# Patient Record
Sex: Male | Born: 1940
Health system: Southern US, Community
[De-identification: ages and names within clinical notes are randomized; demographics above are authoritative.]

## PROBLEM LIST (undated history)

## (undated) DIAGNOSIS — J189 Pneumonia, unspecified organism: Secondary | ICD-10-CM

## (undated) DIAGNOSIS — E119 Type 2 diabetes mellitus without complications: Secondary | ICD-10-CM

## (undated) DIAGNOSIS — I440 Atrioventricular block, first degree: Secondary | ICD-10-CM

## (undated) DIAGNOSIS — R600 Localized edema: Secondary | ICD-10-CM

## (undated) DIAGNOSIS — I10 Essential (primary) hypertension: Secondary | ICD-10-CM

## (undated) DIAGNOSIS — E785 Hyperlipidemia, unspecified: Secondary | ICD-10-CM

## (undated) DIAGNOSIS — N393 Stress incontinence (female) (male): Secondary | ICD-10-CM

## (undated) DIAGNOSIS — N3001 Acute cystitis with hematuria: Secondary | ICD-10-CM

## (undated) DIAGNOSIS — H9190 Unspecified hearing loss, unspecified ear: Secondary | ICD-10-CM

## (undated) DIAGNOSIS — M1990 Unspecified osteoarthritis, unspecified site: Secondary | ICD-10-CM

## (undated) HISTORY — PX: PROSTATE SURGERY: SHX751

## (undated) HISTORY — DX: Unspecified hearing loss, unspecified ear: H91.90

## (undated) HISTORY — DX: Stress incontinence (female) (male): N39.3

## (undated) HISTORY — DX: Atrioventricular block, first degree: I44.0

## (undated) HISTORY — DX: Localized edema: R60.0

## (undated) HISTORY — DX: Hyperlipidemia, unspecified: E78.5

## (undated) HISTORY — PX: COLONOSCOPY: SHX174

## (undated) HISTORY — DX: Acute cystitis with hematuria: N30.01

---

## 2012-12-06 ENCOUNTER — Emergency Department (HOSPITAL_COMMUNITY)
Admission: EM | Admit: 2012-12-06 | Discharge: 2012-12-06 | Disposition: A | Discharge status: Home / Self Care | Payer: Medicare HMO | Attending: Emergency Medicine | Admitting: Emergency Medicine

## 2012-12-06 ENCOUNTER — Encounter (HOSPITAL_COMMUNITY): Payer: Self-pay | Admitting: Emergency Medicine

## 2012-12-06 DIAGNOSIS — I10 Essential (primary) hypertension: Secondary | ICD-10-CM | POA: Insufficient documentation

## 2012-12-06 DIAGNOSIS — M129 Arthropathy, unspecified: Secondary | ICD-10-CM | POA: Insufficient documentation

## 2012-12-06 DIAGNOSIS — Z87891 Personal history of nicotine dependence: Secondary | ICD-10-CM | POA: Insufficient documentation

## 2012-12-06 DIAGNOSIS — Z79899 Other long term (current) drug therapy: Secondary | ICD-10-CM | POA: Insufficient documentation

## 2012-12-06 DIAGNOSIS — Z859 Personal history of malignant neoplasm, unspecified: Secondary | ICD-10-CM | POA: Insufficient documentation

## 2012-12-06 DIAGNOSIS — M5430 Sciatica, unspecified side: Secondary | ICD-10-CM | POA: Insufficient documentation

## 2012-12-06 DIAGNOSIS — M5432 Sciatica, left side: Secondary | ICD-10-CM

## 2012-12-06 HISTORY — DX: Essential (primary) hypertension: I10

## 2012-12-06 HISTORY — DX: Unspecified osteoarthritis, unspecified site: M19.90

## 2012-12-06 MED ORDER — HYDROCODONE-ACETAMINOPHEN 5-325 MG PO TABS: 1.0000 | ORAL_TABLET | Freq: Four times a day (QID) | ORAL | Status: DC | PRN

## 2012-12-06 MED ORDER — DEXAMETHASONE SODIUM PHOSPHATE 10 MG/ML IJ SOLN
10.0000 mg | Freq: Once | INTRAMUSCULAR | Status: AC
  Administered 2012-12-06: 10 mg via INTRAMUSCULAR
  Filled 2012-12-06: qty 1

## 2012-12-06 NOTE — ED Notes (Signed)
Pt states that he experienced leg and back pain, and muscle spasms in his left groin after kneeling on Saturday. Pt reports a hx of arthritis.

## 2012-12-06 NOTE — ED Provider Notes (Signed)
History     CSN: [id number]  Arrival date & time 12/06/12  1142   First MD Initiated Contact with Patient 12/06/12 1225      Chief Complaint  Patient presents with  . Leg Pain    (Consider location/radiation/quality/duration/timing/severity/associated sxs/prior treatment) HPI  72 year old male presents emergency department with chief complaint of left knee pain.  Patient states that on Friday he was at a family reunion and was helping to erect a tent He had 2 squat and get down on his knees to drive in the tent stakes. Saturday the patient began having pain that radiated down the back of his left leg and around the front of his left leg to the knee.  Patient states that he has progressively worsened.  It is worse when he stands erect.  The patient feels more comfortable when he's bent over.  He has never had any pain like this before.  He denies any urinary symptoms.  He denies any history of kidney stones.  Patient denies any abdominal pain, nausea, vomiting. Denies weakness, loss of bowel/bladder function or saddle anesthesia. Denies neck stiffness, headache, rash.  Denies fever or recent procedures to back. Pain described as constant. Intermittently shooting and electric. Radiates to the right hip, groin and knee and down the back of the lef. Denies any testicle pain or flank pain.   Past Medical History  Diagnosis Date  . Arthritis   . Hypertension     Borderline hypertension.  . Cancer     Past Surgical History  Procedure Laterality Date  . Prostate surgery      History reviewed. No pertinent family history.  History  Substance Use Topics  . Smoking status: Former Games developer  . Smokeless tobacco: Not on file  . Alcohol Use: No      Review of Systems Ten systems reviewed and are negative for acute change, except as noted in the HPI.   Allergies  Review of patient's allergies indicates no known allergies.  Home Medications   Current Outpatient Rx  Name  Route  Sig   Dispense  Refill  . amLODipine-benazepril (LOTREL) 5-40 MG per capsule   Oral   Take 1 capsule by mouth daily.           BP 160/74  Pulse 87  Temp(Src) 98.2 F (36.8 C) (Oral)  Resp 18  Ht 5\' 11"  (1.803 m)  Wt 206 lb (93.441 kg)  BMI 28.74 kg/m2  SpO2 99%  Physical Exam Physical Exam  Nursing note and vitals reviewed. Constitutional: He appears well-developed and well-nourished. No distress.  HENT:  Head: Normocephalic and atraumatic.  Eyes: Conjunctivae normal are normal. No scleral icterus.  Neck: Normal range of motion. Neck supple.  Cardiovascular: Normal rate, regular rhythm and normal heart sounds.   Pulmonary/Chest: Effort normal and breath sounds normal. No respiratory distress.  Abdominal: Soft. There is no tenderness.  Musculoskeletal: He exhibits no edema.  Back exam: antalgic gait, limited range of motion, pain with motion noted during exam, tenderness noted in left lumbar paraspinals, normal reflexes and strength bilateral lower extremities, sensory exam intact bilateral lower extremities,. Neurological: He is alert.  Skin: Skin is warm and dry. He is not diaphoretic.  Psychiatric: His behavior is normal.    ED Course  Procedures (including critical care time)  Labs Reviewed - No data to display No results found.   1. Sciatica, left       MDM  1:26 PM BP 160/74  Pulse 87  Temp(Src)  98.2 F (36.8 C) (Oral)  Resp 18  Ht 5\' 11"  (1.803 m)  Wt 206 lb (93.441 kg)  BMI 28.74 kg/m2  SpO2 99% Patient with sxs consistent with L3/L4  And sciatic nerve compression. Also consistent with history. Will D/C with Norco. No imaging indicated. F/U with ortho asap.Arthor Captain, PA-C 12/06/12 1331

## 2012-12-07 NOTE — ED Provider Notes (Signed)
Medical screening examination/treatment/procedure(s) were performed by non-physician practitioner and as supervising physician I was immediately available for consultation/collaboration.    Vida Roller, MD 12/07/12 [phone]

## 2013-05-31 ENCOUNTER — Ambulatory Visit
Admission: RE | Admit: 2013-05-31 | Discharge: 2013-05-31 | Disposition: A | Discharge status: Home / Self Care | Payer: Medicare HMO | Source: Ambulatory Visit | Attending: Family Medicine | Admitting: Family Medicine

## 2013-05-31 ENCOUNTER — Other Ambulatory Visit: Payer: Self-pay | Admitting: Family Medicine

## 2013-05-31 DIAGNOSIS — M25571 Pain in right ankle and joints of right foot: Secondary | ICD-10-CM

## 2013-05-31 DIAGNOSIS — M25511 Pain in right shoulder: Secondary | ICD-10-CM

## 2013-09-18 ENCOUNTER — Ambulatory Visit: Payer: Commercial Managed Care - HMO | Admitting: Podiatry

## 2013-09-25 ENCOUNTER — Ambulatory Visit (INDEPENDENT_AMBULATORY_CARE_PROVIDER_SITE_OTHER): Payer: Commercial Managed Care - HMO

## 2013-09-25 ENCOUNTER — Encounter: Payer: Self-pay | Admitting: Podiatry

## 2013-09-25 ENCOUNTER — Ambulatory Visit (INDEPENDENT_AMBULATORY_CARE_PROVIDER_SITE_OTHER): Payer: Commercial Managed Care - HMO | Admitting: Podiatry

## 2013-09-25 VITALS — BP 138/76 | HR 91 | Resp 16 | Ht 71.0 in | Wt 204.0 lb

## 2013-09-25 DIAGNOSIS — M79609 Pain in unspecified limb: Secondary | ICD-10-CM

## 2013-09-25 DIAGNOSIS — S93409A Sprain of unspecified ligament of unspecified ankle, initial encounter: Secondary | ICD-10-CM

## 2013-09-25 DIAGNOSIS — M79673 Pain in unspecified foot: Secondary | ICD-10-CM

## 2013-09-25 DIAGNOSIS — R609 Edema, unspecified: Secondary | ICD-10-CM

## 2013-09-25 DIAGNOSIS — M779 Enthesopathy, unspecified: Secondary | ICD-10-CM

## 2013-09-25 MED ORDER — TRIAMCINOLONE ACETONIDE 10 MG/ML IJ SUSP
10.0000 mg | Freq: Once | INTRAMUSCULAR | Status: AC
  Administered 2013-09-25: 10 mg

## 2013-09-25 NOTE — Progress Notes (Signed)
   Subjective:    Patient ID: Paul Alvarado, male    DOB: 07/15/1940, 73 y.o.   MRN: [id number]  HPI Comments: N swelling L rt ankle and foot D November 2014 O sudden C worse A walking T soaks in warm water  Foot Pain      Review of Systems  Cardiovascular:       Leg swelling  All other systems reviewed and are negative.       Objective:   Physical Exam        Assessment & Plan:

## 2013-09-25 NOTE — Progress Notes (Signed)
Subjective:     Patient ID: Paul Alvarado, male   DOB: 03-Jan-1941, 73 y.o.   MRN: [id number]  Foot Pain   patient points to right ankle and states that he slipped in November and turned his ankle and it has been swollen ever since. States it is mildly tender but is more the swelling that bothers him   Review of Systems  All other systems reviewed and are negative.       Objective:   Physical Exam  Nursing note and vitals reviewed. Constitutional: He is oriented to person, place, and time.  Cardiovascular: Intact distal pulses.   Musculoskeletal: Normal range of motion.  Neurological: He is oriented to person, place, and time.  Skin: Skin is dry.   neurovascular status was found to be intact with edema in the right foot and ankle noted. I noted there to be normal range of motion the subtalar midtarsal joint left and mild restriction on the right as he is splinting and discomfort within the sinus tarsi on the right foot. Negative Homans sign was noted currently and he states the swelling does go away overnight     Assessment:     Inflammation with chronic swelling of foot and ankle right secondary to injury with inflammation of the sinus tarsi    Plan:     H&P and x-ray reviewed. Injected the right sinus tarsi 3 mg Kenalog 5 mg Xylocaine Marcaine mixture and applied Unna boot Ace wrap to reduce swelling along with surgical shoe.

## 2013-10-02 ENCOUNTER — Encounter: Payer: Self-pay | Admitting: Podiatry

## 2013-10-02 ENCOUNTER — Ambulatory Visit (INDEPENDENT_AMBULATORY_CARE_PROVIDER_SITE_OTHER): Payer: Commercial Managed Care - HMO | Admitting: Podiatry

## 2013-10-02 VITALS — BP 143/78 | HR 88 | Resp 16

## 2013-10-02 DIAGNOSIS — M7750 Other enthesopathy of unspecified foot: Secondary | ICD-10-CM

## 2013-10-02 DIAGNOSIS — R609 Edema, unspecified: Secondary | ICD-10-CM

## 2013-10-03 NOTE — Progress Notes (Signed)
Subjective:     Patient ID: Paul Alvarado, male   DOB: [birth year], 73 y.o.   MRN: [id number]  HPI patient stated my right foot and ankle has started to swell again with some reduction from the injection but frustration because the foot and ankle are still swollen. No pain in my leg or calf   Review of Systems     Objective:   Physical Exam Neurovascular status unchanged with continued edema in the ankle and foot right with minimal discomfort noted upon palpation to the sinus tarsi. Negative Homans sign was noted    Assessment:     Continued swelling of the right foot and ankle but failed to respond to conservative compression therapy    Plan:     I am sending to the pain doctors for evaluation and Doppler with possibility that there may be some kind of a condition creating this chronic swelling

## 2013-10-29 ENCOUNTER — Telehealth (HOSPITAL_COMMUNITY): Payer: Self-pay | Admitting: *Deleted

## 2013-10-29 ENCOUNTER — Ambulatory Visit (INDEPENDENT_AMBULATORY_CARE_PROVIDER_SITE_OTHER): Payer: Commercial Managed Care - HMO

## 2013-10-29 VITALS — BP 180/84 | HR 96 | Resp 16

## 2013-10-29 DIAGNOSIS — M25579 Pain in unspecified ankle and joints of unspecified foot: Secondary | ICD-10-CM

## 2013-10-29 DIAGNOSIS — I872 Venous insufficiency (chronic) (peripheral): Secondary | ICD-10-CM

## 2013-10-29 DIAGNOSIS — R609 Edema, unspecified: Secondary | ICD-10-CM | POA: Diagnosis not present

## 2013-10-29 DIAGNOSIS — M1990 Unspecified osteoarthritis, unspecified site: Secondary | ICD-10-CM

## 2013-10-29 DIAGNOSIS — S93409A Sprain of unspecified ligament of unspecified ankle, initial encounter: Secondary | ICD-10-CM

## 2013-10-29 NOTE — Progress Notes (Signed)
   Subjective:    Patient ID: Paul Alvarado, male    DOB: [birth year], 74 y.o.   MRN: [id number]  HPI Comments: "I am still having trouble with this right ankle and I just wanted another opinion."     Review of Systems  Constitutional: Negative.   HENT: Negative.   Eyes: Negative.   Respiratory: Negative.   Cardiovascular: Positive for leg swelling.  Endocrine: Negative.   Genitourinary: Negative.   Musculoskeletal: Positive for arthralgias and gait problem.  Skin: Negative.   Allergic/Immunologic: Negative.   Neurological: Negative.   Hematological: Negative.   Psychiatric/Behavioral: Negative.   All other systems reviewed and are negative.      Objective:   Physical Exam 73 year old afterward male well-developed well-nourished Zetino x3 presents at this time with a complaint of continued swelling greater than 6 month history of his right leg and ankle area. Digits although some stairs and sprained his ankle reinjuries ankle however x-rays taken at this time AP lateral oblique views the ankle and mortise views the ankle reveal no fracture no osseous abnormality 6 is soft tissue swelling plus one +2 pitting edema the right as opposed left foot and ankle. Foot views also reveals no arthrosis significant me some asymmetric joint space narrowing Lisfranc's and subtalar joint and in the MTP joints although no signs of acute fractures no cysts tumors or other osseous abnormalities were vascular standpoint patient has pedal pulses palpable DP postal for PT plus one over 4 more thready do the edema difficult to palpate temperature warm turgor diminished there is +2 edema no rubor pallor no significant varicosities noted neurologically epicritic and proprioceptive sensations. He intact and symmetric bilateral there is normal plantar response DTRs not listed patient has difficult time reaching abdomen shoes and socks has definitely some arthritis affecting his back and lower legs extremities. Has some  stiffness in both feet and toes as well although relatively normal range of motion both ankles midfoot and rear foot.       Assessment & Plan:  Assessment this time is venous insufficiency cannot rule out mild her feet abnormally symptomatic DVT or clots of his right side. It may recommendations for some compression stockings at this time for [facility] prescription is given for 2030 mm compression knee-high stockings to be worn first thing in the more no pain on palpation of the calf negative Homans sign temperature is normal no palpable cords and nodules or cysts are noted on the right leg or left leg. Again no other abnormal findings dermatologic unremarkable skin color pigment and hair growth are normal this time patient is referred to [facility] for lower from a venous Doppler study to rule out any clot which may be contributing also prescription for compression stockings 20-30 mm compression knee-high stockings is given recheck in 2-3 weeks for followup and reevaluation  Harriet Masson DPM

## 2013-10-29 NOTE — Patient Instructions (Signed)
Compression Stockings Compression stockings are elastic stockings that "compress" your legs. This helps to increase blood flow, decrease swelling, and reduces the chance of getting blood clots in your lower legs. Compression stockings are used:  After surgery.  If you have a history of poor circulation.  If you are prone to blood clots.  If you have varicose veins.  If you sit or are bedridden for long periods of time. WEARING COMPRESSION STOCKINGS  Your compression stockings should be worn as instructed by your caregiver.  Wearing the correct stocking size is important. Your caregiver can help measure and fit you to the correct size.  When wearing your stockings, do not allow the stockings to bunch up. This is especially important around your toes or behind your knees. Keep the stockings as smooth as possible.  Do not roll the stockings downward and leave them rolled down. This can form a restrictive band around your legs and can decrease blood flow.  The stockings should be removed once a day for 1 hour or as instructed by your caregiver. When the stockings are taken off, inspect your legs and feet. Look for:  Open sores.  Red spots.  Puffy areas (swelling).  Anything that does not seem normal. IMPORTANT INFORMATION ABOUT COMPRESSION STOCKINGS  The compression stockings should be clean, dry, and in good condition before you put them on.  Do not put lotion on your legs or feet. This makes it harder to put the stockings on.  Change your stockings immediately if they become wet or soiled.  Do not wear stockings that are ripped or torn.  You may hand-wash or put your stockings in the washing machine. Use cold or warm water with mild detergent. Do not bleach your stockings. They may be air-dried or dried in the dryer on low heat.  If you have pain or have a feeling of "pins and needles" in your feet or legs, you may be wearing stockings that are too tight. Call your caregiver  right away. SEEK IMMEDIATE MEDICAL CARE IF:   You have numbness or tingling in your lower legs that does not get better quickly after the stockings are removed.  Your toes or feet become cold and blue.  You develop open sores or have red spots on your legs that do not go away. MAKE SURE YOU:   Understand these instructions.  Will watch your condition.  Will get help right away if you are not doing well or get worse. Document Released: 04/24/2009 Document Revised: 09/19/2011 Document Reviewed: 04/24/2009 [facility] Patient Information 2014 [place], [place].   Make sure you put a compression stockings on first thing in the morning take a bath or shower in the evening or at bedtime, keep the stockings on your nice and and put them on before your feet or hit ground in the morning.

## 2013-11-04 ENCOUNTER — Telehealth: Payer: Self-pay | Admitting: *Deleted

## 2013-11-04 ENCOUNTER — Ambulatory Visit (HOSPITAL_COMMUNITY)
Admission: RE | Admit: 2013-11-04 | Discharge: 2013-11-04 | Disposition: A | Discharge status: Home / Self Care | Payer: Medicare HMO | Source: Ambulatory Visit

## 2013-11-04 DIAGNOSIS — M79609 Pain in unspecified limb: Secondary | ICD-10-CM

## 2013-11-04 DIAGNOSIS — M25569 Pain in unspecified knee: Secondary | ICD-10-CM | POA: Insufficient documentation

## 2013-11-04 DIAGNOSIS — M25579 Pain in unspecified ankle and joints of unspecified foot: Secondary | ICD-10-CM

## 2013-11-04 DIAGNOSIS — M7989 Other specified soft tissue disorders: Secondary | ICD-10-CM

## 2013-11-04 NOTE — Telephone Encounter (Signed)
Need authorization for lower venous study today from [facility].  Have you received authorization?  I called and gave them the authorization I received from [employer].  Authorization # is [id number].  Expiration date 02/02/2014, Start date 11/04/2013, Number of visits: 1, Procedure 48016,  Diagnosis 719.47

## 2013-11-04 NOTE — Progress Notes (Signed)
Right Lower Ext. Venous Duplex Completed. Negative for DVT. Oda Cogan, BS, RDMS, RVT

## 2013-11-05 ENCOUNTER — Telehealth (HOSPITAL_COMMUNITY): Payer: Self-pay | Admitting: *Deleted

## 2013-12-13 ENCOUNTER — Ambulatory Visit (INDEPENDENT_AMBULATORY_CARE_PROVIDER_SITE_OTHER): Payer: Commercial Managed Care - HMO

## 2013-12-13 VITALS — BP 145/73 | HR 79 | Resp 16

## 2013-12-13 DIAGNOSIS — I872 Venous insufficiency (chronic) (peripheral): Secondary | ICD-10-CM

## 2013-12-13 DIAGNOSIS — S93409A Sprain of unspecified ligament of unspecified ankle, initial encounter: Secondary | ICD-10-CM

## 2013-12-13 DIAGNOSIS — R609 Edema, unspecified: Secondary | ICD-10-CM

## 2013-12-13 DIAGNOSIS — M1990 Unspecified osteoarthritis, unspecified site: Secondary | ICD-10-CM

## 2013-12-13 NOTE — Progress Notes (Signed)
   Subjective:    Patient ID: Paul Alvarado, male    DOB: 1941/05/17, 73 y.o.   MRN: [id number]  HPI Comments: "It feels worse. Its like its up in my knee now too"  Dr. Alessandra Grout would like a note of patient's condition and progress.  Follow up venous insufficiency left foot - doppler results  Foot Pain      Review of Systems no new findings or systemic changes noted     Objective:   Physical Exam 73 year old [place] American male consistent for followup continues to have swelling and tightness of his right lower extremity the Doppler studies were negative for DVT or clots however patient continues to arthrosis affecting his ankle with a history of ankle sprain and injury there still plus one +2 edema the right side as compared to the left patient is on blood pressure medications does have arthralgias maybe having some fluid retention however is wearing compression stockings as instructed patient advised to continue his compression stockings he had been using some naproxen sodium or Aleve for pain which may be exacerbating the swelling I suggested not using Aleve for pain as it may promote additional swelling or prolong any swelling. Patient will discontinue Aleve at this time. He is alternative medicines posse plain Tylenol.       Assessment & Plan:  Assessment peripheral neuropathy idiopathic neuropathy of her lymphedema of the right lower extremity cannot be ruled out if he continues to have difficulties may be K. for referral to the pain clinic for further evaluation her studies however recent Doppler studies are venous Doppler were negative and unremarkable for DVT or clots. Maintain appropriate shoes maintain compression stockings recheck in 3-6 months for followup if needed. Also for a copies of today's and recent visit notes to his primary physician Dr. Pilar Plate [facility] DPM  Harriet Masson DPM

## 2013-12-13 NOTE — Patient Instructions (Signed)

## 2014-05-08 ENCOUNTER — Encounter (HOSPITAL_COMMUNITY): Payer: Self-pay | Admitting: Pharmacy Technician

## 2014-05-08 NOTE — H&P (Signed)
TOTAL HIP ADMISSION H&P  Patient is admitted for right total hip arthroplasty, anterior approach.  Subjective:  Chief Complaint:   Right hip primary OA / pain  HPI: Paul Alvarado, 73 y.o. male, has a history of pain and functional disability in the right hip(s) due to arthritis and patient has failed non-surgical conservative treatments for greater than 12 weeks to include NSAID's and/or analgesics and activity modification.  Onset of symptoms was gradual starting >10 years ago with gradually worsening course since that time.The patient noted no past surgery on the right hip(s).  Patient currently rates pain in the right hip at 8 out of 10 with activity. Patient has night pain, worsening of pain with activity and weight bearing, trendelenberg gait, pain that interfers with activities of daily living and pain with passive range of motion. Patient has evidence of periarticular osteophytes and joint space narrowing by imaging studies. This condition presents safety issues increasing the risk of falls.   There is no current active infection.  Risks, benefits and expectations were discussed with the patient.  Risks including but not limited to the risk of anesthesia, blood clots, nerve damage, blood vessel damage, failure of the prosthesis, infection and up to and including death.  Patient understand the risks, benefits and expectations and wishes to proceed with surgery.   PCP: Lilian Coma, MD  D/C Plans:      Home with HHPT  Post-op Meds:       No Rx given   Tranexamic Acid:      To be given - IV    Decadron:      Is to be given  FYI:     ASA post-op   Norco post-op     Past Medical History  Diagnosis Date  . Arthritis   . Hypertension     Borderline hypertension.  . Cancer     Past Surgical History  Procedure Laterality Date  . Prostate surgery      No prescriptions prior to admission   No Known Allergies   History  Substance Use Topics  . Smoking status: Former Research scientist (life sciences)  .  Smokeless tobacco: Not on file  . Alcohol Use: No       Review of Systems  Constitutional: Negative.   HENT: Negative.   Eyes: Negative.   Respiratory: Negative.   Cardiovascular: Negative.   Gastrointestinal: Negative.   Genitourinary: Negative.   Musculoskeletal: Positive for joint pain.  Skin: Negative.   Neurological: Negative.   Endo/Heme/Allergies: Negative.   Psychiatric/Behavioral: Negative.     Objective:  Physical Exam  Constitutional: He is oriented to person, place, and time. He appears well-developed and well-nourished.  HENT:  Head: Normocephalic and atraumatic.  Eyes: Pupils are equal, round, and reactive to light.  Neck: Neck supple. No JVD present. No tracheal deviation present. No thyromegaly present.  Cardiovascular: Normal rate, regular rhythm, normal heart sounds and intact distal pulses.   Respiratory: Effort normal and breath sounds normal. No stridor. No respiratory distress. He has no wheezes.  GI: Soft. There is no tenderness. There is no guarding.  Musculoskeletal:       Right hip: He exhibits decreased range of motion, decreased strength, tenderness and bony tenderness. He exhibits no swelling, no deformity and no laceration.  Lymphadenopathy:    He has no cervical adenopathy.  Neurological: He is alert and oriented to person, place, and time.  Skin: Skin is warm and dry.  Psychiatric: He has a normal mood and affect.  Labs:  Estimated body mass index is 28.46 kg/(m^2) as calculated from the following:   Height as of 09/25/13: 5\' 11"  (1.803 m).   Weight as of 09/25/13: 92.534 kg (204 lb).   Imaging Review Plain radiographs demonstrate severe degenerative joint disease of the right hip(s). The bone quality appears to be good for age and reported activity level.  Assessment/Plan:  End stage arthritis, right hip(s)  The patient history, physical examination, clinical judgement of the provider and imaging studies are consistent with end  stage degenerative joint disease of the right hip(s) and total hip arthroplasty is deemed medically necessary. The treatment options including medical management, injection therapy, arthroscopy and arthroplasty were discussed at length. The risks and benefits of total hip arthroplasty were presented and reviewed. The risks due to aseptic loosening, infection, stiffness, dislocation/subluxation,  thromboembolic complications and other imponderables were discussed.  The patient acknowledged the explanation, agreed to proceed with the plan and consent was signed. Patient is being admitted for inpatient treatment for surgery, pain control, PT, OT, prophylactic antibiotics, VTE prophylaxis, progressive ambulation and ADL's and discharge planning.The patient is planning to be discharged home with home health services.      [name]   PA-C  05/08/2014, 4:21 PM

## 2014-05-12 NOTE — Patient Instructions (Addendum)
Paul Alvarado  05/12/2014   Your procedure is scheduled on:  05/20/2014    Report to [facility].  Follow the Signs to [place] at [phone]       am  Call this number if you have problems the morning of surgery: [phone]   Remember:   Do not eat food or drink liquids after midnight.   Take these medicines the morning of surgery with A SIP OF WATER: none   Do not wear jewelry,   Do not wear lotions, powders, or perfumes. deodorant.   Men may shave face and neck.  Do not bring valuables to the hospital.  Contacts, dentures or bridgework may not be worn into surgery.  Leave suitcase in the car. After surgery it may be brought to your room.  For patients admitted to the hospital, checkout time is 11:00 AM the day of  discharge.         Please read over the following fact sheets that you were given: MRSA Information, coughing and deep breathing exercises, leg exercises            [facility] - Preparing for Surgery Before surgery, you can play an important role.  Because skin is not sterile, your skin needs to be as free of germs as possible.  You can reduce the number of germs on your skin by washing with CHG (chlorahexidine gluconate) soap before surgery.  CHG is an antiseptic cleaner which kills germs and bonds with the skin to continue killing germs even after washing. Please DO NOT use if you have an allergy to CHG or antibacterial soaps.  If your skin becomes reddened/irritated stop using the CHG and inform your nurse when you arrive at Short Stay. Do not shave (including legs and underarms) for at least 48 hours prior to the first CHG shower.  You may shave your face/neck. Please follow these instructions carefully:  1.  Shower with CHG Soap the night before surgery and the  morning of Surgery.  2.  If you choose to wash your hair, wash your hair first as usual with your  normal  shampoo.  3.  After you shampoo, rinse your hair and body thoroughly to remove the   shampoo.                           4.  Use CHG as you would any other liquid soap.  You can apply chg directly  to the skin and wash                       Gently with a scrungie or clean washcloth.  5.  Apply the CHG Soap to your body ONLY FROM THE NECK DOWN.   Do not use on face/ open                           Wound or open sores. Avoid contact with eyes, ears mouth and genitals (private parts).                       Wash face,  Genitals (private parts) with your normal soap.             6.  Wash thoroughly, paying special attention to the area where your surgery  will be performed.  7.  Thoroughly rinse your body with warm water from  the neck down.  8.  DO NOT shower/wash with your normal soap after using and rinsing off  the CHG Soap.                9.  Pat yourself dry with a clean towel.            10.  Wear clean pajamas.            11.  Place clean sheets on your bed the night of your first shower and do not  sleep with pets. Day of Surgery : Do not apply any lotions/deodorants the morning of surgery.  Please wear clean clothes to the hospital/surgery center.  FAILURE TO FOLLOW THESE INSTRUCTIONS MAY RESULT IN THE CANCELLATION OF YOUR SURGERY PATIENT SIGNATURE_________________________________  NURSE SIGNATURE__________________________________  ________________________________________________________________________  WHAT IS A BLOOD TRANSFUSION? Blood Transfusion Information  A transfusion is the replacement of blood or some of its parts. Blood is made up of multiple cells which provide different functions.  Red blood cells carry oxygen and are used for blood loss replacement.  [place] blood cells fight against infection.  Platelets control bleeding.  Plasma helps clot blood.  Other blood products are available for specialized needs, such as hemophilia or other clotting disorders. BEFORE THE TRANSFUSION  Who gives blood for transfusions?   Healthy volunteers who are fully  evaluated to make sure their blood is safe. This is blood bank blood. Transfusion therapy is the safest it has ever been in the practice of medicine. Before blood is taken from a donor, a complete history is taken to make sure that person has no history of diseases nor engages in risky social behavior (examples are intravenous drug use or sexual activity with multiple partners). The donor's travel history is screened to minimize risk of transmitting infections, such as malaria. The donated blood is tested for signs of infectious diseases, such as HIV and hepatitis. The blood is then tested to be sure it is compatible with you in order to minimize the chance of a transfusion reaction. If you or a relative donates blood, this is often done in anticipation of surgery and is not appropriate for emergency situations. It takes many days to process the donated blood. RISKS AND COMPLICATIONS Although transfusion therapy is very safe and saves many lives, the main dangers of transfusion include:  1. Getting an infectious disease. 2. Developing a transfusion reaction. This is an allergic reaction to something in the blood you were given. Every precaution is taken to prevent this. The decision to have a blood transfusion has been considered carefully by your caregiver before blood is given. Blood is not given unless the benefits outweigh the risks. AFTER THE TRANSFUSION  Right after receiving a blood transfusion, you will usually feel much better and more energetic. This is especially true if your red blood cells have gotten low (anemic). The transfusion raises the level of the red blood cells which carry oxygen, and this usually causes an energy increase.  The nurse administering the transfusion will monitor you carefully for complications. HOME CARE INSTRUCTIONS  No special instructions are needed after a transfusion. You may find your energy is better. Speak with your caregiver about any limitations on activity  for underlying diseases you may have. SEEK MEDICAL CARE IF:   Your condition is not improving after your transfusion.  You develop redness or irritation at the intravenous (IV) site. SEEK IMMEDIATE MEDICAL CARE IF:  Any of the following symptoms occur over the next 12  hours:  Shaking chills.  You have a temperature by mouth above 102 F (38.9 C), not controlled by medicine.  Chest, back, or muscle pain.  People around you feel you are not acting correctly or are confused.  Shortness of breath or difficulty breathing.  Dizziness and fainting.  You get a rash or develop hives.  You have a decrease in urine output.  Your urine turns a dark color or changes to pink, red, or brown. Any of the following symptoms occur over the next 10 days:  You have a temperature by mouth above 102 F (38.9 C), not controlled by medicine.  Shortness of breath.  Weakness after normal activity.  The [place] part of the eye turns yellow (jaundice).  You have a decrease in the amount of urine or are urinating less often.  Your urine turns a dark color or changes to pink, red, or brown. Document Released: 06/24/2000 Document Revised: 09/19/2011 Document Reviewed: 02/11/2008 ExitCare Patient Information 2014 [place].  _______________________________________________________________________  Incentive Spirometer  An incentive spirometer is a tool that can help keep your lungs clear and active. This tool measures how well you are filling your lungs with each breath. Taking long deep breaths may help reverse or decrease the chance of developing breathing (pulmonary) problems (especially infection) following:  A long period of time when you are unable to move or be active. BEFORE THE PROCEDURE   If the spirometer includes an indicator to show your best effort, your nurse or respiratory therapist will set it to a desired goal.  If possible, sit up straight or lean slightly forward. Try not  to slouch.  Hold the incentive spirometer in an upright position. INSTRUCTIONS FOR USE  3. Sit on the edge of your bed if possible, or sit up as far as you can in bed or on a chair. 4. Hold the incentive spirometer in an upright position. 5. Breathe out normally. 6. Place the mouthpiece in your mouth and seal your lips tightly around it. 7. Breathe in slowly and as deeply as possible, raising the piston or the ball toward the top of the column. 8. Hold your breath for 3-5 seconds or for as long as possible. Allow the piston or ball to fall to the bottom of the column. 9. Remove the mouthpiece from your mouth and breathe out normally. 10. Rest for a few seconds and repeat Steps 1 through 7 at least 10 times every 1-2 hours when you are awake. Take your time and take a few normal breaths between deep breaths. 11. The spirometer may include an indicator to show your best effort. Use the indicator as a goal to work toward during each repetition. 12. After each set of 10 deep breaths, practice coughing to be sure your lungs are clear. If you have an incision (the cut made at the time of surgery), support your incision when coughing by placing a pillow or rolled up towels firmly against it. Once you are able to get out of bed, walk around indoors and cough well. You may stop using the incentive spirometer when instructed by your caregiver.  RISKS AND COMPLICATIONS  Take your time so you do not get dizzy or light-headed.  If you are in pain, you may need to take or ask for pain medication before doing incentive spirometry. It is harder to take a deep breath if you are having pain. AFTER USE  Rest and breathe slowly and easily.  It can be helpful to keep  track of a log of your progress. Your caregiver can provide you with a simple table to help with this. If you are using the spirometer at home, follow these instructions: [place] IF:   You are having difficultly using the  spirometer.  You have trouble using the spirometer as often as instructed.  Your pain medication is not giving enough relief while using the spirometer.  You develop fever of 100.5 F (38.1 C) or higher. SEEK IMMEDIATE MEDICAL CARE IF:   You cough up bloody sputum that had not been present before.  You develop fever of 102 F (38.9 C) or greater.  You develop worsening pain at or near the incision site. MAKE SURE YOU:   Understand these instructions.  Will watch your condition.  Will get help right away if you are not doing well or get worse. Document Released: 11/07/2006 Document Revised: 09/19/2011 Document Reviewed: 01/08/2007 [facility] Patient Information 2014 [place], [place].   ________________________________________________________________________

## 2014-05-13 ENCOUNTER — Encounter (HOSPITAL_COMMUNITY): Payer: Self-pay

## 2014-05-13 ENCOUNTER — Ambulatory Visit (HOSPITAL_COMMUNITY)
Admission: RE | Admit: 2014-05-13 | Discharge: 2014-05-13 | Disposition: A | Discharge status: Home / Self Care | Payer: Commercial Managed Care - HMO | Source: Ambulatory Visit | Attending: Orthopedic Surgery | Admitting: Orthopedic Surgery

## 2014-05-13 ENCOUNTER — Encounter (HOSPITAL_COMMUNITY)
Admission: RE | Admit: 2014-05-13 | Discharge: 2014-05-13 | Disposition: A | Discharge status: Home / Self Care | Payer: Commercial Managed Care - HMO | Source: Ambulatory Visit | Attending: Orthopedic Surgery | Admitting: Orthopedic Surgery

## 2014-05-13 DIAGNOSIS — Z01818 Encounter for other preprocedural examination: Secondary | ICD-10-CM | POA: Diagnosis present

## 2014-05-13 DIAGNOSIS — I10 Essential (primary) hypertension: Secondary | ICD-10-CM | POA: Insufficient documentation

## 2014-05-13 HISTORY — DX: Type 2 diabetes mellitus without complications: E11.9

## 2014-05-13 HISTORY — DX: Pneumonia, unspecified organism: J18.9

## 2014-05-13 LAB — URINALYSIS, ROUTINE W REFLEX MICROSCOPIC
BILIRUBIN URINE: NEGATIVE
Glucose, UA: NEGATIVE mg/dL
Hgb urine dipstick: NEGATIVE
Ketones, ur: NEGATIVE mg/dL
LEUKOCYTES UA: NEGATIVE
NITRITE: NEGATIVE
Protein, ur: NEGATIVE mg/dL
SPECIFIC GRAVITY, URINE: 1.019 (ref 1.005–1.030)
Urobilinogen, UA: 0.2 mg/dL (ref 0.0–1.0)
pH: 5.5 (ref 5.0–8.0)

## 2014-05-13 LAB — BASIC METABOLIC PANEL
Anion gap: 12 (ref 5–15)
BUN: 13 mg/dL (ref 6–23)
CHLORIDE: 105 meq/L (ref 96–112)
CO2: 27 meq/L (ref 19–32)
CREATININE: 1.06 mg/dL (ref 0.50–1.35)
Calcium: 9.2 mg/dL (ref 8.4–10.5)
GFR calc non Af Amer: 68 mL/min — ABNORMAL LOW (ref >90)
GFR, EST AFRICAN AMERICAN: 78 mL/min — AB (ref >90)
GLUCOSE: 95 mg/dL (ref 70–99)
POTASSIUM: 4.2 meq/L (ref 3.7–5.3)
Sodium: 144 mEq/L (ref 137–147)

## 2014-05-13 LAB — SURGICAL PCR SCREEN
MRSA, PCR: NEGATIVE
STAPHYLOCOCCUS AUREUS: NEGATIVE

## 2014-05-13 LAB — PROTIME-INR
INR: 1.03 (ref 0.00–1.49)
Prothrombin Time: 13.6 seconds (ref 11.6–15.2)

## 2014-05-13 LAB — CBC
HEMATOCRIT: 37.1 % — AB (ref 39.0–52.0)
HEMOGLOBIN: 12.4 g/dL — AB (ref 13.0–17.0)
MCH: 30.9 pg (ref 26.0–34.0)
MCHC: 33.4 g/dL (ref 30.0–36.0)
MCV: 92.5 fL (ref 78.0–100.0)
Platelets: 187 10*3/uL (ref 150–400)
RBC: 4.01 MIL/uL — AB (ref 4.22–5.81)
RDW: 14.6 % (ref 11.5–15.5)
WBC: 5.2 10*3/uL (ref 4.0–10.5)

## 2014-05-13 LAB — ABO/RH: ABO/RH(D): B POS

## 2014-05-13 LAB — APTT: aPTT: 28 seconds (ref 24–37)

## 2014-05-13 NOTE — Progress Notes (Signed)
EKG- 04/11/2014 on chart  Clearance- DR Stephanie Acre- 04/11/2014 on chart along with LOV note of 04/11/2014 on chart

## 2014-05-20 ENCOUNTER — Encounter (HOSPITAL_COMMUNITY)
Admission: RE | Disposition: A | Discharge status: Home Health | Payer: Self-pay | Source: Ambulatory Visit | Attending: Orthopedic Surgery

## 2014-05-20 ENCOUNTER — Inpatient Hospital Stay (HOSPITAL_COMMUNITY): Payer: Medicare HMO | Admitting: Anesthesiology

## 2014-05-20 ENCOUNTER — Inpatient Hospital Stay (HOSPITAL_COMMUNITY): Payer: Medicare HMO

## 2014-05-20 ENCOUNTER — Inpatient Hospital Stay (HOSPITAL_COMMUNITY)
Admission: RE | Admit: 2014-05-20 | Discharge: 2014-05-21 | DRG: 470 | Disposition: A | Discharge status: Home Health | Payer: Medicare HMO | Source: Ambulatory Visit | Attending: Orthopedic Surgery | Admitting: Orthopedic Surgery

## 2014-05-20 ENCOUNTER — Encounter (HOSPITAL_COMMUNITY): Payer: Self-pay | Admitting: *Deleted

## 2014-05-20 DIAGNOSIS — Z87891 Personal history of nicotine dependence: Secondary | ICD-10-CM

## 2014-05-20 DIAGNOSIS — E118 Type 2 diabetes mellitus with unspecified complications: Secondary | ICD-10-CM | POA: Diagnosis present

## 2014-05-20 DIAGNOSIS — Z96649 Presence of unspecified artificial hip joint: Secondary | ICD-10-CM

## 2014-05-20 DIAGNOSIS — Z6828 Body mass index (BMI) 28.0-28.9, adult: Secondary | ICD-10-CM

## 2014-05-20 DIAGNOSIS — M1611 Unilateral primary osteoarthritis, right hip: Secondary | ICD-10-CM | POA: Diagnosis present

## 2014-05-20 DIAGNOSIS — E663 Overweight: Secondary | ICD-10-CM | POA: Diagnosis present

## 2014-05-20 DIAGNOSIS — I10 Essential (primary) hypertension: Secondary | ICD-10-CM | POA: Diagnosis present

## 2014-05-20 DIAGNOSIS — M25551 Pain in right hip: Secondary | ICD-10-CM | POA: Diagnosis present

## 2014-05-20 HISTORY — PX: TOTAL HIP ARTHROPLASTY: SHX124

## 2014-05-20 LAB — GLUCOSE, CAPILLARY
GLUCOSE-CAPILLARY: 96 mg/dL (ref 70–99)
GLUCOSE-CAPILLARY: 150 mg/dL — AB (ref 70–99)
Glucose-Capillary: 143 mg/dL — ABNORMAL HIGH (ref 70–99)

## 2014-05-20 LAB — TYPE AND SCREEN
ABO/RH(D): B POS
ANTIBODY SCREEN: NEGATIVE

## 2014-05-20 SURGERY — ARTHROPLASTY, HIP, TOTAL, ANTERIOR APPROACH
Anesthesia: Spinal | Site: Hip | Laterality: Right

## 2014-05-20 MED ORDER — ASPIRIN EC 325 MG PO TBEC
325.0000 mg | DELAYED_RELEASE_TABLET | Freq: Two times a day (BID) | ORAL | Status: DC
  Administered 2014-05-21: 325 mg via ORAL
  Filled 2014-05-20 (×3): qty 1

## 2014-05-20 MED ORDER — PROMETHAZINE HCL 25 MG/ML IJ SOLN: 6.2500 mg | INTRAMUSCULAR | Status: DC | PRN

## 2014-05-20 MED ORDER — METOCLOPRAMIDE HCL 10 MG PO TABS: 5.0000 mg | ORAL_TABLET | Freq: Three times a day (TID) | ORAL | Status: DC | PRN

## 2014-05-20 MED ORDER — MIDAZOLAM HCL 5 MG/5ML IJ SOLN
INTRAMUSCULAR | Status: DC | PRN
  Administered 2014-05-20 (×2): 1 mg via INTRAVENOUS

## 2014-05-20 MED ORDER — TRANEXAMIC ACID 100 MG/ML IV SOLN
1000.0000 mg | Freq: Once | INTRAVENOUS | Status: AC
  Administered 2014-05-20: 1000 mg via INTRAVENOUS
  Filled 2014-05-20: qty 10

## 2014-05-20 MED ORDER — PHENOL 1.4 % MT LIQD
1.0000 | OROMUCOSAL | Status: DC | PRN
  Filled 2014-05-20: qty 177

## 2014-05-20 MED ORDER — FENTANYL CITRATE 0.05 MG/ML IJ SOLN
INTRAMUSCULAR | Status: AC
  Filled 2014-05-20: qty 2

## 2014-05-20 MED ORDER — POLYETHYLENE GLYCOL 3350 17 G PO PACK
17.0000 g | PACK | Freq: Two times a day (BID) | ORAL | Status: DC
  Administered 2014-05-20 – 2014-05-21 (×2): 17 g via ORAL

## 2014-05-20 MED ORDER — POTASSIUM CHLORIDE 2 MEQ/ML IV SOLN
100.0000 mL/h | INTRAVENOUS | Status: DC
  Administered 2014-05-20 – 2014-05-21 (×2): 100 mL/h via INTRAVENOUS
  Filled 2014-05-20 (×5): qty 1000

## 2014-05-20 MED ORDER — OXYCODONE HCL 5 MG PO TABS: 5.0000 mg | ORAL_TABLET | Freq: Once | ORAL | Status: DC | PRN

## 2014-05-20 MED ORDER — METHOCARBAMOL 500 MG PO TABS
500.0000 mg | ORAL_TABLET | Freq: Four times a day (QID) | ORAL | Status: DC | PRN
  Administered 2014-05-21: 500 mg via ORAL
  Filled 2014-05-20: qty 1

## 2014-05-20 MED ORDER — OXYCODONE HCL 5 MG/5ML PO SOLN
5.0000 mg | Freq: Once | ORAL | Status: DC | PRN
  Filled 2014-05-20: qty 5

## 2014-05-20 MED ORDER — CEFAZOLIN SODIUM-DEXTROSE 2-3 GM-% IV SOLR
2.0000 g | Freq: Four times a day (QID) | INTRAVENOUS | Status: AC
Start: 2014-05-20 — End: 2014-05-21
  Administered 2014-05-20 – 2014-05-21 (×2): 2 g via INTRAVENOUS
  Filled 2014-05-20 (×2): qty 50

## 2014-05-20 MED ORDER — DIPHENHYDRAMINE HCL 25 MG PO CAPS: 25.0000 mg | ORAL_CAPSULE | Freq: Four times a day (QID) | ORAL | Status: DC | PRN

## 2014-05-20 MED ORDER — DEXAMETHASONE SODIUM PHOSPHATE 10 MG/ML IJ SOLN
10.0000 mg | Freq: Once | INTRAMUSCULAR | Status: AC
  Administered 2014-05-20: 10 mg via INTRAVENOUS

## 2014-05-20 MED ORDER — FENTANYL CITRATE 0.05 MG/ML IJ SOLN
INTRAMUSCULAR | Status: DC | PRN
  Administered 2014-05-20: 50 ug via INTRAVENOUS

## 2014-05-20 MED ORDER — CHLORHEXIDINE GLUCONATE 4 % EX LIQD: 60.0000 mL | Freq: Once | CUTANEOUS | Status: DC

## 2014-05-20 MED ORDER — LACTATED RINGERS IV SOLN
INTRAVENOUS | Status: AC
  Administered 2014-05-20 (×2): via INTRAVENOUS
  Administered 2014-05-20: 1000 mL via INTRAVENOUS

## 2014-05-20 MED ORDER — PROPOFOL 10 MG/ML IV BOLUS
INTRAVENOUS | Status: AC
  Filled 2014-05-20: qty 20

## 2014-05-20 MED ORDER — FERROUS SULFATE 325 (65 FE) MG PO TABS
325.0000 mg | ORAL_TABLET | Freq: Three times a day (TID) | ORAL | Status: DC
  Administered 2014-05-21: 325 mg via ORAL
  Filled 2014-05-20 (×5): qty 1

## 2014-05-20 MED ORDER — ONDANSETRON HCL 4 MG/2ML IJ SOLN
4.0000 mg | Freq: Four times a day (QID) | INTRAMUSCULAR | Status: DC | PRN
  Administered 2014-05-21: 4 mg via INTRAVENOUS
  Filled 2014-05-20: qty 2

## 2014-05-20 MED ORDER — METHOCARBAMOL 1000 MG/10ML IJ SOLN
500.0000 mg | Freq: Four times a day (QID) | INTRAVENOUS | Status: DC | PRN
  Administered 2014-05-20: 500 mg via INTRAVENOUS
  Filled 2014-05-20 (×3): qty 5

## 2014-05-20 MED ORDER — ALUM & MAG HYDROXIDE-SIMETH 200-200-20 MG/5ML PO SUSP: 30.0000 mL | ORAL | Status: DC | PRN

## 2014-05-20 MED ORDER — DEXAMETHASONE SODIUM PHOSPHATE 10 MG/ML IJ SOLN
10.0000 mg | Freq: Once | INTRAMUSCULAR | Status: AC
  Administered 2014-05-21: 10 mg via INTRAVENOUS
  Filled 2014-05-20: qty 1

## 2014-05-20 MED ORDER — MEPERIDINE HCL 50 MG/ML IJ SOLN: 6.2500 mg | INTRAMUSCULAR | Status: DC | PRN

## 2014-05-20 MED ORDER — BUPIVACAINE HCL (PF) 0.5 % IJ SOLN
INTRAMUSCULAR | Status: DC | PRN
  Administered 2014-05-20: 2.5 mL

## 2014-05-20 MED ORDER — ONDANSETRON HCL 4 MG PO TABS: 4.0000 mg | ORAL_TABLET | Freq: Four times a day (QID) | ORAL | Status: DC | PRN

## 2014-05-20 MED ORDER — MAGNESIUM CITRATE PO SOLN: 1.0000 | Freq: Once | ORAL | Status: AC | PRN

## 2014-05-20 MED ORDER — DOCUSATE SODIUM 100 MG PO CAPS
100.0000 mg | ORAL_CAPSULE | Freq: Two times a day (BID) | ORAL | Status: DC
  Administered 2014-05-20 – 2014-05-21 (×2): 100 mg via ORAL

## 2014-05-20 MED ORDER — 0.9 % SODIUM CHLORIDE (POUR BTL) OPTIME
TOPICAL | Status: DC | PRN
  Administered 2014-05-20: 1000 mL

## 2014-05-20 MED ORDER — HYDROMORPHONE HCL 1 MG/ML IJ SOLN
0.5000 mg | INTRAMUSCULAR | Status: DC | PRN
  Administered 2014-05-20 (×3): 1 mg via INTRAVENOUS
  Administered 2014-05-20: 0.5 mg via INTRAVENOUS
  Filled 2014-05-20 (×4): qty 1

## 2014-05-20 MED ORDER — CEFAZOLIN SODIUM-DEXTROSE 2-3 GM-% IV SOLR
2.0000 g | INTRAVENOUS | Status: AC
  Administered 2014-05-20: 2 g via INTRAVENOUS

## 2014-05-20 MED ORDER — MENTHOL 3 MG MT LOZG
1.0000 | LOZENGE | OROMUCOSAL | Status: DC | PRN
  Filled 2014-05-20: qty 9

## 2014-05-20 MED ORDER — HYDROMORPHONE HCL 1 MG/ML IJ SOLN: 0.2500 mg | INTRAMUSCULAR | Status: DC | PRN

## 2014-05-20 MED ORDER — METOCLOPRAMIDE HCL 5 MG/ML IJ SOLN: 5.0000 mg | Freq: Three times a day (TID) | INTRAMUSCULAR | Status: DC | PRN

## 2014-05-20 MED ORDER — PROPOFOL INFUSION 10 MG/ML OPTIME
INTRAVENOUS | Status: DC | PRN
  Administered 2014-05-20: 180 ug/kg/min via INTRAVENOUS

## 2014-05-20 MED ORDER — BISACODYL 10 MG RE SUPP: 10.0000 mg | Freq: Every day | RECTAL | Status: DC | PRN

## 2014-05-20 MED ORDER — CEFAZOLIN SODIUM-DEXTROSE 2-3 GM-% IV SOLR
INTRAVENOUS | Status: AC
  Filled 2014-05-20: qty 50

## 2014-05-20 MED ORDER — PROPOFOL 10 MG/ML IV BOLUS
INTRAVENOUS | Status: DC | PRN
  Administered 2014-05-20: 30 mg via INTRAVENOUS

## 2014-05-20 MED ORDER — MIDAZOLAM HCL 2 MG/2ML IJ SOLN
INTRAMUSCULAR | Status: AC
  Filled 2014-05-20: qty 2

## 2014-05-20 MED ORDER — CELECOXIB 200 MG PO CAPS
200.0000 mg | ORAL_CAPSULE | Freq: Two times a day (BID) | ORAL | Status: DC
Start: 2014-05-20 — End: 2014-05-21
  Administered 2014-05-20 – 2014-05-21 (×2): 200 mg via ORAL
  Filled 2014-05-20 (×3): qty 1

## 2014-05-20 MED ORDER — HYDROCODONE-ACETAMINOPHEN 7.5-325 MG PO TABS
1.0000 | ORAL_TABLET | ORAL | Status: DC
  Administered 2014-05-20: 1 via ORAL
  Administered 2014-05-20 – 2014-05-21 (×5): 2 via ORAL
  Filled 2014-05-20: qty 1
  Filled 2014-05-20 (×5): qty 2

## 2014-05-20 SURGICAL SUPPLY — 40 items
BAG ZIPLOCK 12X15 (MISCELLANEOUS) IMPLANT
CAPT HIP PF MOP ×3 IMPLANT
COVER PERINEAL POST (MISCELLANEOUS) ×3 IMPLANT
DRAPE C-ARM 42X120 X-RAY (DRAPES) ×3 IMPLANT
DRAPE STERI IOBAN 125X83 (DRAPES) ×3 IMPLANT
DRAPE U-SHAPE 47X51 STRL (DRAPES) ×9 IMPLANT
DRSG AQUACEL AG ADV 3.5X10 (GAUZE/BANDAGES/DRESSINGS) ×3 IMPLANT
DURAPREP 26ML APPLICATOR (WOUND CARE) ×3 IMPLANT
ELECT BLADE TIP CTD 4 INCH (ELECTRODE) ×3 IMPLANT
ELECT REM PT RETURN 9FT ADLT (ELECTROSURGICAL) ×3
ELECTRODE REM PT RTRN 9FT ADLT (ELECTROSURGICAL) ×1 IMPLANT
FACESHIELD WRAPAROUND (MASK) ×9 IMPLANT
GLOVE BIO SURGEON STRL SZ7.5 (GLOVE) ×3 IMPLANT
GLOVE BIOGEL PI IND STRL 7.5 (GLOVE) ×2 IMPLANT
GLOVE BIOGEL PI IND STRL 8 (GLOVE) ×1 IMPLANT
GLOVE BIOGEL PI IND STRL 8.5 (GLOVE) ×1 IMPLANT
GLOVE BIOGEL PI INDICATOR 7.5 (GLOVE) ×4
GLOVE BIOGEL PI INDICATOR 8 (GLOVE) ×2
GLOVE BIOGEL PI INDICATOR 8.5 (GLOVE) ×2
GLOVE ECLIPSE 7.5 STRL STRAW (GLOVE) ×3 IMPLANT
GLOVE ECLIPSE 8.0 STRL XLNG CF (GLOVE) ×6 IMPLANT
GLOVE ORTHO TXT STRL SZ7.5 (GLOVE) ×3 IMPLANT
GOWN SPEC L3 XXLG W/TWL (GOWN DISPOSABLE) ×6 IMPLANT
GOWN STRL REUS W/TWL LRG LVL3 (GOWN DISPOSABLE) ×3 IMPLANT
GOWN STRL REUS W/TWL XL LVL3 (GOWN DISPOSABLE) ×3 IMPLANT
HOLDER FOLEY CATH W/STRAP (MISCELLANEOUS) ×3 IMPLANT
KIT BASIN OR (CUSTOM PROCEDURE TRAY) ×3 IMPLANT
LIQUID BAND (GAUZE/BANDAGES/DRESSINGS) ×3 IMPLANT
PACK TOTAL JOINT (CUSTOM PROCEDURE TRAY) ×3 IMPLANT
SAW OSC TIP CART 19.5X105X1.3 (SAW) ×3 IMPLANT
STEM TRI LOC BPS GRIPTON SZ 5 (Hips) ×1 IMPLANT
SUT MNCRL AB 4-0 PS2 18 (SUTURE) ×3 IMPLANT
SUT VIC AB 1 CT1 36 (SUTURE) ×9 IMPLANT
SUT VIC AB 2-0 CT1 27 (SUTURE) ×4
SUT VIC AB 2-0 CT1 TAPERPNT 27 (SUTURE) ×2 IMPLANT
SUT VLOC 180 0 24IN GS25 (SUTURE) ×3 IMPLANT
TOWEL OR 17X26 10 PK STRL BLUE (TOWEL DISPOSABLE) ×3 IMPLANT
TRAY FOLEY CATH 16FRSI W/METER (SET/KITS/TRAYS/PACK) ×3 IMPLANT
TRI LOC BPS W GRIPTON SZ 5 (Hips) ×3 IMPLANT
WATER STERILE IRR 1500ML POUR (IV SOLUTION) ×3 IMPLANT

## 2014-05-20 NOTE — Op Note (Signed)
NAME:  Paul Alvarado                ACCOUNT NO.: [id number]      MEDICAL RECORD NO.: [id number]      FACILITY:  [facility]      PHYSICIAN:  Paralee Cancel D  DATE OF BIRTH:  [birth year]     DATE OF PROCEDURE:  05/20/2014                                 OPERATIVE REPORT         PREOPERATIVE DIAGNOSIS: Right  hip osteoarthritis.      POSTOPERATIVE DIAGNOSIS:  Right hip osteoarthritis.      PROCEDURE:  Right total hip replacement through an anterior approach   utilizing DePuy THR system, component size 32mm pinnacle cup, a size 36+4 neutral   Altrex liner, a size 5 Hi Tri Lock stem with a 36+1.5 Articuleze metal head ball.      SURGEON:  Pietro Cassis. Alvan Dame, M.D.      ASSISTANT:  Danae Orleans, PA-C     ANESTHESIA:  Spinal.      SPECIMENS:  None.      COMPLICATIONS:  None.      BLOOD LOSS:  400 cc     DRAINS:  None.      INDICATION OF THE PROCEDURE:  Paul Alvarado is a 73 y.o. male who had   presented to office for evaluation of right hip pain.  Radiographs revealed   progressive degenerative changes with bone-on-bone   articulation to the  hip joint.  The patient had painful limited range of   motion significantly affecting their overall quality of life.  The patient was failing to    respond to conservative measures, and at this point was ready   to proceed with more definitive measures.  The patient has noted progressive   degenerative changes in his hip, progressive problems and dysfunction   with regarding the hip prior to surgery.  Consent was obtained for   benefit of pain relief.  Specific risk of infection, DVT, component   failure, dislocation, need for revision surgery, as well discussion of   the anterior versus posterior approach were reviewed.  Consent was   obtained for benefit of anterior pain relief through an anterior   approach.      PROCEDURE IN DETAIL:  The patient was brought to operative theater.   Once adequate anesthesia, preoperative  antibiotics, 2gm of Ancef administered.   The patient was positioned supine on the OSI Hanna table.  Once adequate   padding of boney process was carried out, we had predraped out the hip, and  used fluoroscopy to confirm orientation of the pelvis and position.      The right hip was then prepped and draped from proximal iliac crest to   mid thigh with shower curtain technique.      Time-out was performed identifying the patient, planned procedure, and   extremity.     An incision was then made 2 cm distal and lateral to the   anterior superior iliac spine extending over the orientation of the   tensor fascia lata muscle and sharp dissection was carried down to the   fascia of the muscle and protractor placed in the soft tissues.      The fascia was then incised.  The muscle belly was identified and swept   laterally  and retractor placed along the superior neck.  Following   cauterization of the circumflex vessels and removing some pericapsular   fat, a second cobra retractor was placed on the inferior neck.  A third   retractor was placed on the anterior acetabulum after elevating the   anterior rectus.  A L-capsulotomy was along the line of the   superior neck to the trochanteric fossa, then extended proximally and   distally.  Tag sutures were placed and the retractors were then placed   intracapsular.  We then identified the trochanteric fossa and   orientation of my neck cut, confirmed this radiographically   and then made a neck osteotomy with the femur on traction.  The femoral   head was removed without difficulty or complication.  Traction was let   off and retractors were placed posterior and anterior around the   acetabulum.      The labrum and foveal tissue were debrided.  I began reaming with a 73mm   reamer and reamed up to 28mm reamer with good bony bed preparation and a 30mm   cup was chosen.  The final 8mm Pinnacle cup was then impacted under fluoroscopy  to confirm  the depth of penetration and orientation with respect to   abduction.  A screw was placed followed by the hole eliminator.  The final   36+4 neutral Altrex liner was impacted with good visualized rim fit.  The cup was positioned anatomically within the acetabular portion of the pelvis.      At this point, the femur was rolled at 80 degrees.  Further capsule was   released off the inferior aspect of the femoral neck.  I then   released the superior capsule proximally.  The hook was placed laterally   along the femur and elevated manually and held in position with the bed   hook.  The leg was then extended and adducted with the leg rolled to 100   degrees of external rotation.  Once the proximal femur was fully   exposed, I used a box osteotome to set orientation.  I then began   broaching with the starting chili pepper broach and passed this by hand and then broached up to 5.  With the 5 broach in place I chose a high offset neck and did a trial reduction.  The offset was appropriate, leg lengths   appeared to be equal with the +1.5 trial head ball,  confirmed radiographically.   Given these findings, I went ahead and dislocated the hip, repositioned all   retractors and positioned the right hip in the extended and abducted position.  The final 5 Hi Tri Lock stem was   chosen and it was impacted down to the level of neck cut.  Based on this   and the trial reduction, a 36+1.5 Articuleze metal head ball was chosen and   impacted onto a clean and dry trunnion, and the hip was reduced.  The   hip had been irrigated throughout the case again at this point.  I did   reapproximate the superior capsular leaflet to the anterior leaflet   using #1 Vicryl.  The fascia of the   tensor fascia lata muscle was then reapproximated using #1 Vicryl and #0 V-lock sutures.  The   remaining wound was closed with 2-0 Vicryl and running 4-0 Monocryl.   The hip was cleaned, dried, and dressed sterilely using  Dermabond and   Aquacel dressing.  He was  then brought   to recovery room in stable condition tolerating the procedure well.    Danae Orleans, PA-C was present for the entirety of the case involved from   preoperative positioning, perioperative retractor management, general   facilitation of the case, as well as primary wound closure as assistant.            Pietro Cassis Alvan Dame, M.D.        05/20/2014 2:38 PM

## 2014-05-20 NOTE — Progress Notes (Signed)
Utilization review completed.  

## 2014-05-20 NOTE — Transfer of Care (Signed)
Immediate Anesthesia Transfer of Care Note  Patient: Paul Alvarado  Procedure(s) Performed: Procedure(s): RIGHT TOTAL HIP ARTHROPLASTY ANTERIOR APPROACH (Right)  Patient Location: PACU  Anesthesia Type:Regional and Spinal  Level of Consciousness: awake, sedated and patient cooperative  Airway & Oxygen Therapy: Patient Spontanous Breathing and Patient connected to face mask oxygen  Post-op Assessment: Report given to PACU RN and Post -op Vital signs reviewed and stable  Post vital signs: Reviewed and stable  Complications: No apparent anesthesia complications

## 2014-05-20 NOTE — Anesthesia Preprocedure Evaluation (Signed)
Anesthesia Evaluation  Patient identified by MRN, date of birth, ID band Patient awake    Reviewed: Allergy & Precautions, H&P , NPO status , Patient's Chart, lab work & pertinent test results  Airway Mallampati: II  TM Distance: >3 FB Neck ROM: Full    Dental no notable dental hx.    Pulmonary pneumonia -, former smoker,  breath sounds clear to auscultation  Pulmonary exam normal       Cardiovascular hypertension, Pt. on medications Rhythm:Regular Rate:Normal     Neuro/Psych negative neurological ROS  negative psych ROS   GI/Hepatic negative GI ROS, Neg liver ROS,   Endo/Other  diabetes, Well Controlled, Type 2  Renal/GU negative Renal ROS     Musculoskeletal  (+) Arthritis -, Osteoarthritis,    Abdominal   Peds  Hematology negative hematology ROS (+)   Anesthesia Other Findings   Reproductive/Obstetrics                             Anesthesia Physical Anesthesia Plan  ASA: II  Anesthesia Plan: Spinal   Post-op Pain Management:    Induction:   Airway Management Planned:   Additional Equipment: None  Intra-op Plan:   Post-operative Plan:   Informed Consent: I have reviewed the patients History and Physical, chart, labs and discussed the procedure including the risks, benefits and alternatives for the proposed anesthesia with the patient or authorized representative who has indicated his/her understanding and acceptance.   Dental advisory given  Plan Discussed with: CRNA  Anesthesia Plan Comments:         Anesthesia Quick Evaluation

## 2014-05-20 NOTE — Anesthesia Postprocedure Evaluation (Signed)
Anesthesia Post Note  Patient: Paul Alvarado  Procedure(s) Performed: Procedure(s) (LRB): RIGHT TOTAL HIP ARTHROPLASTY ANTERIOR APPROACH (Right)  Anesthesia type: Spinal  Patient location: PACU  Post pain: Pain level controlled  Post assessment: Post-op Vital signs reviewed  Last Vitals: BP 149/70 mmHg  Pulse 59  Temp(Src) 36.4 C (Oral)  Resp 16  Ht 5\' 11"  (1.803 m)  Wt 210 lb (95.255 kg)  BMI 29.30 kg/m2  SpO2 100%  Post vital signs: Reviewed  Level of consciousness: sedated  Complications: No apparent anesthesia complications

## 2014-05-20 NOTE — Interval H&P Note (Signed)
History and Physical Interval Note:  05/20/2014 11:18 AM  Paul Alvarado  has presented today for surgery, with the diagnosis of RIGHT HIP OA  The various methods of treatment have been discussed with the patient and family. After consideration of risks, benefits and other options for treatment, the patient has consented to  Procedure(s): RIGHT TOTAL HIP ARTHROPLASTY ANTERIOR APPROACH (Right) as a surgical intervention .  The patient's history has been reviewed, patient examined, no change in status, stable for surgery.  I have reviewed the patient's chart and labs.  Questions were answered to the patient's satisfaction.     [name]

## 2014-05-20 NOTE — Anesthesia Procedure Notes (Signed)
Spinal Patient location during procedure: OR Staffing Anesthesiologist: Nolon Nations R Performed by: anesthesiologist  Preanesthetic Checklist Completed: patient identified, site marked, surgical consent, pre-op evaluation, timeout performed, IV checked, risks and benefits discussed and monitors and equipment checked Spinal Block Patient position: sitting Prep: Betadine Patient monitoring: heart rate, continuous pulse ox and blood pressure Approach: left paramedian Location: L2-3 Injection technique: single-shot Needle Needle type: Quincke  Needle gauge: 22 G Needle length: 9 cm Assessment Sensory level: T8 Additional Notes Expiration date of kit checked and confirmed. Patient tolerated procedure well, without complications.

## 2014-05-21 ENCOUNTER — Encounter (HOSPITAL_COMMUNITY): Payer: Self-pay | Admitting: Orthopedic Surgery

## 2014-05-21 DIAGNOSIS — E663 Overweight: Secondary | ICD-10-CM | POA: Diagnosis present

## 2014-05-21 LAB — CBC
HEMATOCRIT: 33 % — AB (ref 39.0–52.0)
Hemoglobin: 11.1 g/dL — ABNORMAL LOW (ref 13.0–17.0)
MCH: 30.8 pg (ref 26.0–34.0)
MCHC: 33.6 g/dL (ref 30.0–36.0)
MCV: 91.7 fL (ref 78.0–100.0)
PLATELETS: 186 10*3/uL (ref 150–400)
RBC: 3.6 MIL/uL — ABNORMAL LOW (ref 4.22–5.81)
RDW: 14.4 % (ref 11.5–15.5)
WBC: 10.1 10*3/uL (ref 4.0–10.5)

## 2014-05-21 LAB — BASIC METABOLIC PANEL
ANION GAP: 11 (ref 5–15)
BUN: 15 mg/dL (ref 6–23)
CO2: 25 mEq/L (ref 19–32)
Calcium: 8.6 mg/dL (ref 8.4–10.5)
Chloride: 101 mEq/L (ref 96–112)
Creatinine, Ser: 1.03 mg/dL (ref 0.50–1.35)
GFR, EST AFRICAN AMERICAN: 81 mL/min — AB (ref >90)
GFR, EST NON AFRICAN AMERICAN: 70 mL/min — AB (ref >90)
Glucose, Bld: 152 mg/dL — ABNORMAL HIGH (ref 70–99)
POTASSIUM: 4.5 meq/L (ref 3.7–5.3)
Sodium: 137 mEq/L (ref 137–147)

## 2014-05-21 LAB — GLUCOSE, CAPILLARY: Glucose-Capillary: 131 mg/dL — ABNORMAL HIGH (ref 70–99)

## 2014-05-21 MED ORDER — TIZANIDINE HCL 4 MG PO TABS: 4.0000 mg | ORAL_TABLET | Freq: Four times a day (QID) | ORAL | Status: DC | PRN

## 2014-05-21 MED ORDER — HYDROCODONE-ACETAMINOPHEN 7.5-325 MG PO TABS: 1.0000 | ORAL_TABLET | ORAL | Status: DC | PRN

## 2014-05-21 MED ORDER — FERROUS SULFATE 325 (65 FE) MG PO TABS: 325.0000 mg | ORAL_TABLET | Freq: Three times a day (TID) | ORAL | Status: DC

## 2014-05-21 MED ORDER — ASPIRIN 325 MG PO TBEC: 325.0000 mg | DELAYED_RELEASE_TABLET | Freq: Two times a day (BID) | ORAL | Status: AC

## 2014-05-21 MED ORDER — TIZANIDINE HCL 4 MG PO TABS
4.0000 mg | ORAL_TABLET | Freq: Four times a day (QID) | ORAL | Status: DC | PRN
Start: 2014-05-21 — End: 2014-05-21

## 2014-05-21 MED ORDER — DSS 100 MG PO CAPS: 100.0000 mg | ORAL_CAPSULE | Freq: Two times a day (BID) | ORAL | Status: DC

## 2014-05-21 MED ORDER — POLYETHYLENE GLYCOL 3350 17 G PO PACK: 17.0000 g | PACK | Freq: Two times a day (BID) | ORAL | Status: DC

## 2014-05-21 MED ORDER — PROMETHAZINE HCL 12.5 MG PO TABS: 12.5000 mg | ORAL_TABLET | Freq: Four times a day (QID) | ORAL | Status: DC | PRN

## 2014-05-21 MED ORDER — AMLODIPINE BESYLATE 5 MG PO TABS
5.0000 mg | ORAL_TABLET | Freq: Every day | ORAL | Status: DC
  Administered 2014-05-21: 5 mg via ORAL
  Filled 2014-05-21: qty 1

## 2014-05-21 MED ORDER — BENAZEPRIL HCL 40 MG PO TABS
40.0000 mg | ORAL_TABLET | Freq: Every day | ORAL | Status: DC
  Administered 2014-05-21: 40 mg via ORAL
  Filled 2014-05-21: qty 1

## 2014-05-21 NOTE — Plan of Care (Signed)
Problem: Phase I Progression Outcomes Goal: Pain controlled with appropriate interventions Outcome: Progressing     

## 2014-05-21 NOTE — Progress Notes (Signed)
Physical Therapy Treatment Patient Details Name: Paul Alvarado MRN: [id number] DOB: 1940-12-12 Today's Date: 2014-05-25    History of Present Illness R THR    PT Comments    Pt progressing well with mobility but continues with intermittent nausea.  Reviewed car transfers with pt and family.  No stairs to enter home.  Pt stays on main level.  Follow Up Recommendations  Home health PT     Equipment Recommendations  None recommended by PT    Recommendations for Other Services OT consult     Precautions / Restrictions Precautions Precautions: Fall Restrictions Weight Bearing Restrictions: No Other Position/Activity Restrictions: WBAT    Mobility  Bed Mobility                  Transfers Overall transfer level: Needs assistance Equipment used: Rolling walker (2 wheeled) Transfers: Sit to/from Stand Sit to Stand: Supervision         General transfer comment: cues for UE/LE placement  Ambulation/Gait Ambulation/Gait assistance: Min guard;Supervision Ambulation Distance (Feet): 350 Feet (and 25') Assistive device: Rolling walker (2 wheeled) Gait Pattern/deviations: Step-to pattern;Step-through pattern;Decreased step length - right;Decreased step length - left;Trunk flexed     General Gait Details: cues for posture, sequence, position from RW and stride length; pt ambulated 25' with RW before becoming nauseaous and assist to sitting.  After ~5 minutes pt able to continue and progress to recip gait   Stairs            Wheelchair Mobility    Modified Rankin (Stroke Patients Only)       Balance                                    Cognition Arousal/Alertness: Awake/alert Behavior During Therapy: WFL for tasks assessed/performed Overall Cognitive Status: Within Functional Limits for tasks assessed                      Exercises      General Comments        Pertinent Vitals/Pain Pain Assessment: 0-10 Pain Score: 3  Pain  Location: R hip Pain Descriptors / Indicators: Aching;Burning Pain Intervention(s): Limited activity within patient's tolerance;Monitored during session;Premedicated before session;Ice applied    Home Living                      Prior Function            PT Goals (current goals can now be found in the care plan section) Acute Rehab PT Goals Patient Stated Goal: Resume previous lifestyle with decreased pain PT Goal Formulation: With patient Time For Goal Achievement: 05/24/14 Potential to Achieve Goals: Good Progress towards PT goals: Progressing toward goals    Frequency  7X/week    PT Plan Current plan remains appropriate    Co-evaluation             End of Session Equipment Utilized During Treatment: Gait belt Activity Tolerance: Patient tolerated treatment well Patient left: in chair;with call bell/phone within reach;with family/visitor present     Time: 1315-1345 PT Time Calculation (min) (ACUTE ONLY): 30 min  Charges:  $Gait Training: 23-37 mins                    G Codes:      Mahima Hottle 05-25-14, 3:33 PM

## 2014-05-21 NOTE — Plan of Care (Signed)
Problem: Phase I Progression Outcomes Goal: Initial discharge plan identified Outcome: Completed/Met Date Met:  05/21/14     

## 2014-05-21 NOTE — Evaluation (Signed)
Occupational Therapy Evaluation Patient Details Name: Paul Alvarado MRN: [id number] DOB: 08-17-1940 Today's Date: 05/21/2014    History of Present Illness R THR   Clinical Impression   This 73 year old man was admitted for a R DA THA.  All education was completed. No further OT is needed at this time.      Follow Up Recommendations  No OT follow up;Supervision/Assistance - 24 hour    Equipment Recommendations  3 in 1 bedside comode    Recommendations for Other Services       Precautions / Restrictions Precautions Precautions: Fall Restrictions Weight Bearing Restrictions: No Other Position/Activity Restrictions: WBAT      Mobility Bed Mobility              Transfers Overall transfer level: Needs assistance Equipment used: Rolling walker (2 wheeled) Transfers: Sit to/from Stand Sit to Stand: Min assist         General transfer comment: cues for UE/LE placement    Balance                                            ADL Overall ADL's : Needs assistance/impaired     Grooming: Wash/dry hands;Supervision/safety;Standing   Upper Body Bathing: Set up;Sitting   Lower Body Bathing: Moderate assistance;Sit to/from stand   Upper Body Dressing : Set up;Sitting   Lower Body Dressing: Moderate assistance;Sit to/from stand   Toilet Transfer: Minimal assistance;Comfort height toilet   Toileting- Clothing Manipulation and Hygiene: Minimal assistance;Sit to/from stand   Tub/ Shower Transfer: Min guard;Ambulation;Walk-in shower     General ADL Comments: Practiced bathrom transfers.  He has a comfort height commode with nothing to use to support himself.  Educated on using seat and RW; pt needed min A to control descent and to boost up.  Recommend 3:1 over commode for increased safety and independence.  Pt stood and used urinal in bathroom with min guard.  Practiced shower transfer.  Pt has a built in seat but may want to use 3:1 for increased  safety and independence with higher surface area and armrests.  Wife will assist with adls as needed at home     Vision                     Perception     Praxis      Pertinent Vitals/Pain Pain Assessment: 0-10 Pain Score: 2  Pain Location: R hip Pain Descriptors / Indicators: Aching Pain Intervention(s): Limited activity within patient's tolerance;Monitored during session;Premedicated before session;Repositioned;Ice applied     Hand Dominance Right   Extremity/Trunk Assessment Upper Extremity Assessment Upper Extremity Assessment: Overall WFL for tasks assessed      Cervical / Trunk Assessment Cervical / Trunk Assessment: Normal   Communication Communication Communication: No difficulties   Cognition Arousal/Alertness: Awake/alert Behavior During Therapy: WFL for tasks assessed/performed Overall Cognitive Status: Within Functional Limits for tasks assessed                     General Comments       Exercises      Shoulder Instructions      Home Living Family/patient expects to be discharged to:: Private residence Living Arrangements: Spouse/significant other Available Help at Discharge: Family Type of Home: House Home Access: Level entry     Home Layout: One level     Bathroom  Shower/Tub: Occupational psychologist: Handicapped height     Home Equipment: Environmental consultant - standard          Prior Functioning/Environment Level of Independence: Independent             OT Diagnosis: Generalized weakness   OT Problem List:     OT Treatment/Interventions:      OT Goals(Current goals can be found in the care plan section) Acute Rehab OT Goals Patient Stated Goal: Resume previous lifestyle with decreased pain  OT Frequency:     Barriers to D/C:            Co-evaluation              End of Session    Activity Tolerance: Patient tolerated treatment well Patient left: in chair;with call bell/phone within reach    Time: 0340-3524 OT Time Calculation (min): 29 min Charges:  OT General Charges $OT Visit: 1 Procedure OT Evaluation $Initial OT Evaluation Tier I: 1 Procedure OT Treatments $Self Care/Home Management : 23-37 mins G-Codes:    Sieanna Vanstone 2014/05/30, 12:03 PM  Lesle Chris, OTR/L [phone] 2014/05/30

## 2014-05-21 NOTE — Plan of Care (Signed)
Problem: Phase I Progression Outcomes Goal: Hemodynamically stable Outcome: Completed/Met Date Met:  05/21/14     

## 2014-05-21 NOTE — Plan of Care (Signed)
Problem: Phase I Progression Outcomes Goal: Dangle or out of bed evening of surgery Outcome: Completed/Met Date Met:  05/21/14     

## 2014-05-21 NOTE — Progress Notes (Signed)
     Subjective: 1 Day Post-Op Procedure(s) (LRB): RIGHT TOTAL HIP ARTHROPLASTY ANTERIOR APPROACH (Right)   Patient reports pain as mild, pain controlled. Little nausea this morning. No events throughout the night. Ready to be discharged home if he does well with PT and pain stays controlled.  Objective:   VITALS:   Filed Vitals:   05/21/14 0900  BP: 150/66  Pulse: 66  Temp: 98.3 F (36.8 C)  Resp: 16    Dorsiflexion/Plantar flexion intact Incision: dressing C/D/I No cellulitis present Compartment soft  LABS  Recent Labs  05/21/14 0418  HGB 11.1*  HCT 33.0*  WBC 10.1  PLT 186     Recent Labs  05/21/14 0418  NA 137  K 4.5  BUN 15  CREATININE 1.03  GLUCOSE 152*     Assessment/Plan: 1 Day Post-Op Procedure(s) (LRB): RIGHT TOTAL HIP ARTHROPLASTY ANTERIOR APPROACH (Right) Foley cath d/c'ed Advance diet Up with therapy D/C IV fluids Discharge home with home health  Follow up in 2 weeks at [facility]. Follow up with [facility],[place] in 2 weeks.  Contact information:  [facility] [address] [place] [phone]    Overweight (BMI 25-29.9) Estimated body mass index is 29.3 kg/(m^2) as calculated from the following:   Height as of this encounter: 5\' 11"  (1.803 m).   Weight as of this encounter: 95.255 kg (210 lb). Patient also counseled that weight may inhibit the healing process Patient counseled that losing weight will help with future health issues      West Pugh. Yolanda Huffstetler   PAC  05/21/2014, 9:32 AM

## 2014-05-21 NOTE — Plan of Care (Signed)
Problem: Phase I Progression Outcomes Goal: Other Phase I Outcomes/Goals Outcome: Completed/Met Date Met:  05/21/14     

## 2014-05-21 NOTE — Evaluation (Signed)
Physical Therapy Evaluation Patient Details Name: Paul Alvarado MRN: [id number] DOB: [birth year] Today's Date: 05/21/2014   History of Present Illness  R THR  Clinical Impression  PT s/p R THR presents with decreased R LE strength/ROM and post op pain limiting functional mobility.  Pt should progress to d/c home with family assist and HHPT follow up.    Follow Up Recommendations Home health PT    Equipment Recommendations  None recommended by PT    Recommendations for Other Services OT consult     Precautions / Restrictions Precautions Precautions: Fall Restrictions Weight Bearing Restrictions: No Other Position/Activity Restrictions: WBAT      Mobility  Bed Mobility Overal bed mobility: Needs Assistance Bed Mobility: Supine to Sit     Supine to sit: Min assist     General bed mobility comments: cues for sequence and use of L LE to self assist  Transfers Overall transfer level: Needs assistance Equipment used: Rolling walker (2 wheeled) Transfers: Sit to/from Stand Sit to Stand: Min assist         General transfer comment: cues for LE management and use of UEs to self assist  Ambulation/Gait Ambulation/Gait assistance: Min assist Ambulation Distance (Feet): 75 Feet Assistive device: Rolling walker (2 wheeled) Gait Pattern/deviations: Step-to pattern;Decreased step length - right;Decreased step length - left;Shuffle;Trunk flexed Gait velocity: decr   General Gait Details: cues for posture, sequence, position from RW and stride length  Stairs            Wheelchair Mobility    Modified Rankin (Stroke Patients Only)       Balance                                             Pertinent Vitals/Pain Pain Assessment: 0-10 Pain Score: 3  Pain Location: R hip and thigh Pain Descriptors / Indicators: Aching;Burning Pain Intervention(s): Limited activity within patient's tolerance;Monitored during session;Ice applied;Premedicated  before session    Home Living Family/patient expects to be discharged to:: Private residence Living Arrangements: Spouse/significant other Available Help at Discharge: Family Type of Home: House Home Access: Level entry     Home Layout: One [place]: Environmental consultant - standard      Prior Function Level of Independence: Independent               Hand Dominance   Dominant Hand: Right    Extremity/Trunk Assessment   Upper Extremity Assessment: Overall WFL for tasks assessed           Lower Extremity Assessment: RLE deficits/detail RLE Deficits / Details: 2+/5 hip strength with AAROM at hip to 85 flex and 15 abd    Cervical / Trunk Assessment: Normal  Communication   Communication: No difficulties  Cognition Arousal/Alertness: Awake/alert Behavior During Therapy: WFL for tasks assessed/performed Overall Cognitive Status: Within Functional Limits for tasks assessed                      General Comments      Exercises Total Joint Exercises Ankle Circles/Pumps: AROM;Both;15 reps;Supine Quad Sets: AROM;Both;10 reps;Supine Heel Slides: AAROM;Right;15 reps;Supine Hip ABduction/ADduction: AAROM;Right;10 reps;Supine      Assessment/Plan    PT Assessment Patient needs continued PT services  PT Diagnosis Difficulty walking   PT Problem List Decreased strength;Decreased range of motion;Decreased activity tolerance;Decreased mobility;Decreased knowledge of use of DME;Pain  PT Treatment Interventions  DME instruction;Gait training;Stair training;Functional mobility training;Therapeutic activities;Therapeutic exercise;Patient/family education   PT Goals (Current goals can be found in the Care Plan section) Acute Rehab PT Goals Patient Stated Goal: Resume previous lifestyle with decreased pain PT Goal Formulation: With patient Time For Goal Achievement: 05/24/14 Potential to Achieve Goals: Good    Frequency 7X/week   Barriers to discharge         Co-evaluation               End of Session Equipment Utilized During Treatment: Gait belt Activity Tolerance: Patient tolerated treatment well Patient left: in chair;with call bell/phone within reach Nurse Communication: Mobility status         Time: 0815-0900 PT Time Calculation (min) (ACUTE ONLY): 45 min   Charges:   PT Evaluation $Initial PT Evaluation Tier I: 1 Procedure PT Treatments $Gait Training: 8-22 mins $Therapeutic Exercise: 8-22 mins   PT G Codes:          Michaeljoseph Revolorio 05/21/2014, 11:41 AM

## 2014-05-21 NOTE — Plan of Care (Signed)
Problem: Phase I Progression Outcomes Goal: CMS/Neurovascular status WDL Outcome: Completed/Met Date Met:  05/21/14     

## 2014-05-21 NOTE — Care Management Note (Addendum)
    Page 1 of 2   05/21/2014     2:23:17 PM CARE MANAGEMENT NOTE 05/21/2014  Patient:  Paul Alvarado, Paul Alvarado   Account Number:  [id number]  Date Initiated:  05/21/2014  Documentation initiated by:  [facility]  Subjective/Objective Assessment:   adm: Right total hip replacement through an anterior approach       Action/Plan:   discharge planning   Anticipated DC Date:  05/21/2014   Anticipated DC Plan:  [place]  CM consult      [place]   Choice offered to / List presented to:  C-1 Patient   DME arranged  3-N-1  Vassie Moselle      DME agency  [facility]     HH arranged  HH-2 PT      [place].   Status of service:   Medicare Important Message given?   (If response is "NO", the following Medicare IM given date fields will be blank) Date Medicare IM given:   Medicare IM given by:   Date Additional Medicare IM given:   Additional Medicare IM given by:    Discharge Disposition:  [place]  Per UR Regulation:    If discussed at Long Length of Stay Meetings, dates discussed:    Comments:  05/21/14 14:15 CM received call from [facility] requesting to speak to pt for copay payment.  CM handed off phone to pt. [employer] will deliver 3n1 and rolling walker to address today. No other CM needs were communicated.  Mariane Masters, BSN, CM [phone]. 13:13  CM called [employer] to check on status of DME order; Levada Dy  states [employer] has not approved order-waiting on approval. CM requested to speak with supervisor; Lajuana Matte, researched issue and states pt's insurance "[employer]" takes longer to get authorization.  CM spoke with pt who agrees he does not want to wait for rolling walker and 3n1 and at the time of discharge, he wishes to leave.  Pt has a standard walker in the car for his transport home and Tenisha verbalized understanding to please deliver the DME at the home of the pt.   [employer] will call pt at home and work discuss the copays prior to delivery and pt understands he can deny acceptance of DME if he desires.  No other CM needs were communicated.  Mariane Masters, BSN,  CM [phone].  11:45 CM called [employer] to check on status of DME order; Sung Amabile has not approved order-waiting on approval.  Will continue to monitor for status.  Mariane Masters, BSN, CM [phone].   09:45 CM met with pt at 07:30 to offer choice of home health agency.  Pt chooses [facility] to render HHPT.  Pt's insurance is [employer] and choice is limited for DME.  CM called and faxed request for 3n1 and rolling to be delivered to pt's room at 07:45.  CM made a follow up  call at 09:45 and [employer] rep states they will be calling pt with verification and CM gave [employer] [phone] as contact number for pt. Address and contact information verified with pt.  Referral for HHPT texted to [facility] rep, Kristen.  No other CM needs were communicated.  Mariane Masters, BSN, CM [phone].

## 2014-05-22 LAB — GLUCOSE, CAPILLARY: Glucose-Capillary: 126 mg/dL — ABNORMAL HIGH (ref 70–99)

## 2014-05-22 NOTE — Progress Notes (Signed)
Progress note for day of DC sent to payer via Provider Norm Parcel

## 2014-05-28 NOTE — Discharge Summary (Signed)
Physician Discharge Summary  Patient ID: Paul Alvarado MRN: [id number] DOB/AGE: 73/14/1942 73 y.o.  Admit date: 05/20/2014 Discharge date: 05/21/2014   Procedures:  Procedure(s) (LRB): RIGHT TOTAL HIP ARTHROPLASTY ANTERIOR APPROACH (Right)  Attending Physician:  Dr. Paralee Cancel   Admission Diagnoses:   Right hip primary OA / pain  Discharge Diagnoses:  Principal Problem:   S/P right THA, AA Active Problems:   Overweight (BMI 25.0-29.9)  Past Medical History  Diagnosis Date  . Arthritis   . Hypertension     Borderline hypertension.  . Pneumonia     hx of walking pneumonia   . Diabetes mellitus without complication     no meds, borderline, watching diet     HPI: Paul Alvarado, 73 y.o. male, has a history of pain and functional disability in the right hip(s) due to arthritis and patient has failed non-surgical conservative treatments for greater than 12 weeks to include NSAID's and/or analgesics and activity modification. Onset of symptoms was gradual starting >10 years ago with gradually worsening course since that time.The patient noted no past surgery on the right hip(s). Patient currently rates pain in the right hip at 8 out of 10 with activity. Patient has night pain, worsening of pain with activity and weight bearing, trendelenberg gait, pain that interfers with activities of daily living and pain with passive range of motion. Patient has evidence of periarticular osteophytes and joint space narrowing by imaging studies. This condition presents safety issues increasing the risk of falls. There is no current active infection. Risks, benefits and expectations were discussed with the patient. Risks including but not limited to the risk of anesthesia, blood clots, nerve damage, blood vessel damage, failure of the prosthesis, infection and up to and including death. Patient understand the risks, benefits and expectations and wishes to proceed with surgery.   PCP: Lilian Coma, MD   Discharged Condition: good  Hospital Course:  Patient underwent the above stated procedure on 05/20/2014. Patient tolerated the procedure well and brought to the recovery room in good condition and subsequently to the floor.  POD #1 BP: 150/66 ; Pulse: 66 ; Temp: 98.3 F (36.8 C) ; Resp: 16 Patient reports pain as mild, pain controlled. Little nausea this morning. No events throughout the night. Ready to be discharged home. Dorsiflexion/plantar flexion intact, incision: dressing C/D/I, no cellulitis present and compartment soft.   LABS  Basename    HGB  11.1  HCT  33.0     Discharge Exam: General appearance: alert, cooperative and no distress Extremities: Homans sign is negative, no sign of DVT, no edema, redness or tenderness in the calves or thighs and no ulcers, gangrene or trophic changes  Disposition: Home with follow up in 2 weeks   Follow-up Information    Follow up with [name], MD. Schedule an appointment as soon as possible for a visit in 2 weeks.   Specialty:  Orthopedic Surgery   Contact information:   [address] [place] [address] [phone]       Follow up with [facility].   Why:  home health physical therapy   Contact information:   [address] [place] [address] [phone]       Follow up with [facility].   Why:  3n1 (commode) and rolling walker   Contact information:   [place] [address] [phone]       Discharge Instructions    Call MD / Call  911    Complete by:  As directed   If you experience chest pain or shortness of breath, CALL 911 and be transported to the hospital emergency room.  If you develope a fever above 101 F, pus (Akhter drainage) or increased drainage or redness at the wound, or calf pain, call your surgeon's office.     Change dressing    Complete by:  As directed   Maintain surgical dressing for 10-14 days, or until  follow up in the clinic.     Constipation Prevention    Complete by:  As directed   Drink plenty of fluids.  Prune juice may be helpful.  You may use a stool softener, such as Colace (over the counter) 100 mg twice a day.  Use MiraLax (over the counter) for constipation as needed.     Diet - low sodium heart healthy    Complete by:  As directed      Discharge instructions    Complete by:  As directed   Maintain surgical dressing for 10-14 days, or until follow up in the clinic. Follow up in 2 weeks at [facility]. Call with any questions or concerns.     Increase activity slowly as tolerated    Complete by:  As directed      TED hose    Complete by:  As directed   Use stockings (TED hose) for 2 weeks on both leg(s).  You may remove them at night for sleeping.     Weight bearing as tolerated    Complete by:  As directed   Laterality:  right  Extremity:  Lower             Medication List    STOP taking these medications        BAYER BACK & BODY PAIN EX ST 500-32.5 MG Tabs  Generic drug:  Aspirin-Caffeine      TAKE these medications        amLODipine-benazepril 5-40 MG per capsule  Commonly known as:  LOTREL  Take 1 capsule by mouth every morning.     aspirin 325 MG EC tablet  Take 1 tablet (325 mg total) by mouth 2 (two) times daily.     DSS 100 MG Caps  Take 100 mg by mouth 2 (two) times daily.     ferrous sulfate 325 (65 FE) MG tablet  Take 1 tablet (325 mg total) by mouth 3 (three) times daily after meals.     HYDROcodone-acetaminophen 7.5-325 MG per tablet  Commonly known as:  NORCO  Take 1-2 tablets by mouth every 4 (four) hours as needed for moderate pain.     polyethylene glycol packet  Commonly known as:  MIRALAX / GLYCOLAX  Take 17 g by mouth 2 (two) times daily.     promethazine 12.5 MG tablet  Commonly known as:  PHENERGAN  Take 1 tablet (12.5 mg total) by mouth every 6 (six) hours as needed for nausea or vomiting.     tiZANidine 4 MG  tablet  Commonly known as:  ZANAFLEX  Take 1 tablet (4 mg total) by mouth every 6 (six) hours as needed for muscle spasms.         Signed: West Pugh. Ayako Tapanes   PA-C  05/28/2014, 4:22 PM

## 2014-11-24 DIAGNOSIS — E8881 Metabolic syndrome: Secondary | ICD-10-CM | POA: Diagnosis not present

## 2014-11-24 DIAGNOSIS — E785 Hyperlipidemia, unspecified: Secondary | ICD-10-CM | POA: Diagnosis not present

## 2014-11-24 DIAGNOSIS — Z79899 Other long term (current) drug therapy: Secondary | ICD-10-CM | POA: Diagnosis not present

## 2014-11-24 DIAGNOSIS — D509 Iron deficiency anemia, unspecified: Secondary | ICD-10-CM | POA: Diagnosis not present

## 2014-11-24 DIAGNOSIS — I10 Essential (primary) hypertension: Secondary | ICD-10-CM | POA: Diagnosis not present

## 2014-11-24 DIAGNOSIS — Z23 Encounter for immunization: Secondary | ICD-10-CM | POA: Diagnosis not present

## 2014-11-24 DIAGNOSIS — R739 Hyperglycemia, unspecified: Secondary | ICD-10-CM | POA: Diagnosis not present

## 2015-06-18 DIAGNOSIS — Z23 Encounter for immunization: Secondary | ICD-10-CM | POA: Diagnosis not present

## 2015-07-30 DIAGNOSIS — Z0000 Encounter for general adult medical examination without abnormal findings: Secondary | ICD-10-CM | POA: Diagnosis not present

## 2015-07-30 DIAGNOSIS — Z79899 Other long term (current) drug therapy: Secondary | ICD-10-CM | POA: Diagnosis not present

## 2015-07-30 DIAGNOSIS — I10 Essential (primary) hypertension: Secondary | ICD-10-CM | POA: Diagnosis not present

## 2015-07-30 DIAGNOSIS — R7303 Prediabetes: Secondary | ICD-10-CM | POA: Diagnosis not present

## 2015-07-30 DIAGNOSIS — E785 Hyperlipidemia, unspecified: Secondary | ICD-10-CM | POA: Diagnosis not present

## 2015-07-30 DIAGNOSIS — Z8546 Personal history of malignant neoplasm of prostate: Secondary | ICD-10-CM | POA: Diagnosis not present

## 2015-08-27 DIAGNOSIS — H5213 Myopia, bilateral: Secondary | ICD-10-CM | POA: Diagnosis not present

## 2015-08-27 DIAGNOSIS — H5210 Myopia, unspecified eye: Secondary | ICD-10-CM | POA: Diagnosis not present

## 2015-09-17 DIAGNOSIS — H5203 Hypermetropia, bilateral: Secondary | ICD-10-CM | POA: Diagnosis not present

## 2015-09-17 DIAGNOSIS — H52209 Unspecified astigmatism, unspecified eye: Secondary | ICD-10-CM | POA: Diagnosis not present

## 2015-09-17 DIAGNOSIS — H524 Presbyopia: Secondary | ICD-10-CM | POA: Diagnosis not present

## 2015-09-17 DIAGNOSIS — Z0100 Encounter for examination of eyes and vision without abnormal findings: Secondary | ICD-10-CM | POA: Diagnosis not present

## 2015-11-27 DIAGNOSIS — R202 Paresthesia of skin: Secondary | ICD-10-CM | POA: Diagnosis not present

## 2015-11-27 DIAGNOSIS — M25519 Pain in unspecified shoulder: Secondary | ICD-10-CM | POA: Diagnosis not present

## 2015-11-27 DIAGNOSIS — R7301 Impaired fasting glucose: Secondary | ICD-10-CM | POA: Diagnosis not present

## 2016-02-09 DIAGNOSIS — M4310 Spondylolisthesis, site unspecified: Secondary | ICD-10-CM | POA: Diagnosis not present

## 2016-02-09 DIAGNOSIS — R202 Paresthesia of skin: Secondary | ICD-10-CM | POA: Diagnosis not present

## 2016-02-09 DIAGNOSIS — M4696 Unspecified inflammatory spondylopathy, lumbar region: Secondary | ICD-10-CM | POA: Diagnosis not present

## 2016-02-09 DIAGNOSIS — M79659 Pain in unspecified thigh: Secondary | ICD-10-CM | POA: Diagnosis not present

## 2016-02-10 ENCOUNTER — Other Ambulatory Visit: Payer: Self-pay | Admitting: Family Medicine

## 2016-02-10 ENCOUNTER — Ambulatory Visit
Admission: RE | Admit: 2016-02-10 | Discharge: 2016-02-10 | Disposition: A | Discharge status: Home / Self Care | Payer: Commercial Managed Care - HMO | Source: Ambulatory Visit | Attending: Family Medicine | Admitting: Family Medicine

## 2016-02-10 DIAGNOSIS — M47816 Spondylosis without myelopathy or radiculopathy, lumbar region: Secondary | ICD-10-CM | POA: Diagnosis not present

## 2016-02-10 DIAGNOSIS — R202 Paresthesia of skin: Secondary | ICD-10-CM

## 2016-02-10 DIAGNOSIS — M4310 Spondylolisthesis, site unspecified: Secondary | ICD-10-CM

## 2016-05-12 DIAGNOSIS — Z23 Encounter for immunization: Secondary | ICD-10-CM | POA: Diagnosis not present

## 2016-07-01 DIAGNOSIS — M25571 Pain in right ankle and joints of right foot: Secondary | ICD-10-CM | POA: Diagnosis not present

## 2016-07-01 DIAGNOSIS — G8929 Other chronic pain: Secondary | ICD-10-CM | POA: Diagnosis not present

## 2016-07-01 DIAGNOSIS — G5761 Lesion of plantar nerve, right lower limb: Secondary | ICD-10-CM | POA: Diagnosis not present

## 2016-07-18 DIAGNOSIS — M25571 Pain in right ankle and joints of right foot: Secondary | ICD-10-CM | POA: Diagnosis not present

## 2016-07-18 DIAGNOSIS — G8929 Other chronic pain: Secondary | ICD-10-CM | POA: Diagnosis not present

## 2016-08-08 DIAGNOSIS — Z8546 Personal history of malignant neoplasm of prostate: Secondary | ICD-10-CM | POA: Diagnosis not present

## 2016-08-08 DIAGNOSIS — R7303 Prediabetes: Secondary | ICD-10-CM | POA: Diagnosis not present

## 2016-08-08 DIAGNOSIS — Z0000 Encounter for general adult medical examination without abnormal findings: Secondary | ICD-10-CM | POA: Diagnosis not present

## 2016-08-08 DIAGNOSIS — Z79899 Other long term (current) drug therapy: Secondary | ICD-10-CM | POA: Diagnosis not present

## 2016-08-08 DIAGNOSIS — I10 Essential (primary) hypertension: Secondary | ICD-10-CM | POA: Diagnosis not present

## 2016-08-08 DIAGNOSIS — E785 Hyperlipidemia, unspecified: Secondary | ICD-10-CM | POA: Diagnosis not present

## 2017-04-06 DIAGNOSIS — T7840XA Allergy, unspecified, initial encounter: Secondary | ICD-10-CM | POA: Diagnosis not present

## 2017-04-06 DIAGNOSIS — M25572 Pain in left ankle and joints of left foot: Secondary | ICD-10-CM | POA: Diagnosis not present

## 2017-04-06 DIAGNOSIS — Z23 Encounter for immunization: Secondary | ICD-10-CM | POA: Diagnosis not present

## 2017-04-06 DIAGNOSIS — G5760 Lesion of plantar nerve, unspecified lower limb: Secondary | ICD-10-CM | POA: Diagnosis not present

## 2017-04-12 ENCOUNTER — Encounter: Payer: Self-pay | Admitting: Podiatry

## 2017-04-12 ENCOUNTER — Ambulatory Visit: Payer: Medicare HMO

## 2017-04-12 ENCOUNTER — Ambulatory Visit (INDEPENDENT_AMBULATORY_CARE_PROVIDER_SITE_OTHER): Payer: Medicare HMO | Admitting: Podiatry

## 2017-04-12 ENCOUNTER — Telehealth: Payer: Self-pay | Admitting: *Deleted

## 2017-04-12 ENCOUNTER — Ambulatory Visit (INDEPENDENT_AMBULATORY_CARE_PROVIDER_SITE_OTHER): Payer: Medicare HMO

## 2017-04-12 VITALS — BP 153/82 | HR 100 | Resp 18

## 2017-04-12 DIAGNOSIS — R609 Edema, unspecified: Secondary | ICD-10-CM

## 2017-04-12 DIAGNOSIS — I872 Venous insufficiency (chronic) (peripheral): Secondary | ICD-10-CM | POA: Diagnosis not present

## 2017-04-12 DIAGNOSIS — M659 Synovitis and tenosynovitis, unspecified: Secondary | ICD-10-CM

## 2017-04-12 NOTE — Progress Notes (Signed)
   Subjective:    Patient ID: Paul Alvarado, male    DOB: 1941-02-03, 76 y.o.   MRN: [id number]  HPI  76 year old male presents For Concerns of Bilateral Foot and Ankle Swelling Which Is Been Ongoing for about 2 Years with the Right Side Worse Than the Left. He states of the right side started after a fall. He was seen after that injury. He no longer has any pain to his right foot or ankle or to the left side his only concern is the swelling. He had seen another physician for this in regards to possible surgical intervention of the right ankle. He has been worked compression socks which do help when he wears them but he states when he takes them off the swelling comes back. He also has been told that he has had some nerve damage on the right side. He describes and itching sensation to his legs at times but denies any burning, sharp pain. He has pedis he tried physical therapy without any significant improvement as well. He has no other concerns. Review of Systems  All other systems reviewed and are negative.      Objective:   Physical Exam  General: AAO x3, NAD  Dermatological: No open lesions or pre-ulcer lesions identified. No significant skin fissures or any interdigital maceration.  Vascular: DP pulses 2/4, left PT 2/4, right 1/4. There is decreased pedal hair. There is mild swelling to bilateral foot and ankle.  Neruologic: Sensation appears to be intact with Derrel Nip monofilament. Intact with light touch.  Musculoskeletal: There is no area pinpoint bony tenderness or pain the vibratory sensation. There is no pain to the foot or ankle bilaterally. There is no overlying erythema or increase in warmth. MMT 5/5. Range of motion intact.  Gait: Unassisted, Nonantalgic.     Assessment & Plan:  76 year old male bilateral foot/ankle swelling -Treatment options discussed including all alternatives, risks, and complications -Etiology of symptoms were discussed -X-rays were obtained and  reviewed with the patient. No definitive evidence of acute fracture. Vessel calcifications present. -Due to swelling as well as x-ray findings are performed the ABI the office today under the pilot study. On the Left it was 1.05 and right 1.07. I have ordered a venous reflux study as I think that the swelling is more venous in nature. Consider a vein specialist referral pending ultrasound. He has no pain to the foot or ankle today. There is no erythema or signs of infection.  -Follow-up after venous reflux study or sooner if any changes.   Celesta Gentile, DPM

## 2017-04-12 NOTE — Telephone Encounter (Addendum)
-----   Message from Trula Slade, DPM sent at 04/12/2017 10:42 AM EDT ----- Can you please order a venous reflux study due to swelling bilaterally? Thanks. Orders faxed to [facility].

## 2017-04-13 ENCOUNTER — Ambulatory Visit (HOSPITAL_COMMUNITY)
Admission: RE | Admit: 2017-04-13 | Discharge: 2017-04-13 | Disposition: A | Discharge status: Home / Self Care | Payer: Medicare HMO | Source: Ambulatory Visit | Attending: Vascular Surgery | Admitting: Vascular Surgery

## 2017-04-13 DIAGNOSIS — I8393 Asymptomatic varicose veins of bilateral lower extremities: Secondary | ICD-10-CM | POA: Diagnosis not present

## 2017-04-13 DIAGNOSIS — R609 Edema, unspecified: Secondary | ICD-10-CM | POA: Diagnosis not present

## 2017-05-02 DIAGNOSIS — L509 Urticaria, unspecified: Secondary | ICD-10-CM | POA: Diagnosis not present

## 2017-05-02 DIAGNOSIS — I10 Essential (primary) hypertension: Secondary | ICD-10-CM | POA: Diagnosis not present

## 2017-08-17 DIAGNOSIS — M4310 Spondylolisthesis, site unspecified: Secondary | ICD-10-CM | POA: Diagnosis not present

## 2017-08-17 DIAGNOSIS — Z79899 Other long term (current) drug therapy: Secondary | ICD-10-CM | POA: Diagnosis not present

## 2017-08-17 DIAGNOSIS — E1121 Type 2 diabetes mellitus with diabetic nephropathy: Secondary | ICD-10-CM | POA: Diagnosis not present

## 2017-08-17 DIAGNOSIS — M1611 Unilateral primary osteoarthritis, right hip: Secondary | ICD-10-CM | POA: Diagnosis not present

## 2017-08-17 DIAGNOSIS — E1165 Type 2 diabetes mellitus with hyperglycemia: Secondary | ICD-10-CM | POA: Diagnosis not present

## 2017-08-17 DIAGNOSIS — Z0000 Encounter for general adult medical examination without abnormal findings: Secondary | ICD-10-CM | POA: Diagnosis not present

## 2017-08-17 DIAGNOSIS — E785 Hyperlipidemia, unspecified: Secondary | ICD-10-CM | POA: Diagnosis not present

## 2017-08-17 DIAGNOSIS — N182 Chronic kidney disease, stage 2 (mild): Secondary | ICD-10-CM | POA: Diagnosis not present

## 2017-08-17 DIAGNOSIS — Z8546 Personal history of malignant neoplasm of prostate: Secondary | ICD-10-CM | POA: Diagnosis not present

## 2017-09-04 DIAGNOSIS — H52209 Unspecified astigmatism, unspecified eye: Secondary | ICD-10-CM | POA: Diagnosis not present

## 2017-09-04 DIAGNOSIS — H5203 Hypermetropia, bilateral: Secondary | ICD-10-CM | POA: Diagnosis not present

## 2017-09-04 DIAGNOSIS — H524 Presbyopia: Secondary | ICD-10-CM | POA: Diagnosis not present

## 2017-09-06 DIAGNOSIS — R600 Localized edema: Secondary | ICD-10-CM | POA: Diagnosis not present

## 2017-10-13 DIAGNOSIS — I8312 Varicose veins of left lower extremity with inflammation: Secondary | ICD-10-CM | POA: Diagnosis not present

## 2017-10-13 DIAGNOSIS — I8311 Varicose veins of right lower extremity with inflammation: Secondary | ICD-10-CM | POA: Diagnosis not present

## 2017-10-20 DIAGNOSIS — H40053 Ocular hypertension, bilateral: Secondary | ICD-10-CM | POA: Diagnosis not present

## 2017-11-03 DIAGNOSIS — H2513 Age-related nuclear cataract, bilateral: Secondary | ICD-10-CM | POA: Diagnosis not present

## 2017-11-08 DIAGNOSIS — I8311 Varicose veins of right lower extremity with inflammation: Secondary | ICD-10-CM | POA: Diagnosis not present

## 2017-11-17 DIAGNOSIS — Z01818 Encounter for other preprocedural examination: Secondary | ICD-10-CM | POA: Diagnosis not present

## 2017-11-17 DIAGNOSIS — H2511 Age-related nuclear cataract, right eye: Secondary | ICD-10-CM | POA: Diagnosis not present

## 2017-11-20 DIAGNOSIS — H409 Unspecified glaucoma: Secondary | ICD-10-CM | POA: Diagnosis not present

## 2017-11-20 DIAGNOSIS — H40051 Ocular hypertension, right eye: Secondary | ICD-10-CM | POA: Diagnosis not present

## 2017-11-20 DIAGNOSIS — H2511 Age-related nuclear cataract, right eye: Secondary | ICD-10-CM | POA: Diagnosis not present

## 2017-11-20 DIAGNOSIS — H25811 Combined forms of age-related cataract, right eye: Secondary | ICD-10-CM | POA: Diagnosis not present

## 2017-11-22 DIAGNOSIS — E1165 Type 2 diabetes mellitus with hyperglycemia: Secondary | ICD-10-CM | POA: Diagnosis not present

## 2017-11-22 DIAGNOSIS — E1121 Type 2 diabetes mellitus with diabetic nephropathy: Secondary | ICD-10-CM | POA: Diagnosis not present

## 2017-11-22 DIAGNOSIS — Z1211 Encounter for screening for malignant neoplasm of colon: Secondary | ICD-10-CM | POA: Diagnosis not present

## 2017-12-26 DIAGNOSIS — I8311 Varicose veins of right lower extremity with inflammation: Secondary | ICD-10-CM | POA: Diagnosis not present

## 2017-12-29 DIAGNOSIS — I8311 Varicose veins of right lower extremity with inflammation: Secondary | ICD-10-CM | POA: Diagnosis not present

## 2018-01-01 DIAGNOSIS — H2512 Age-related nuclear cataract, left eye: Secondary | ICD-10-CM | POA: Diagnosis not present

## 2018-01-01 DIAGNOSIS — H25812 Combined forms of age-related cataract, left eye: Secondary | ICD-10-CM | POA: Diagnosis not present

## 2018-01-01 DIAGNOSIS — H40052 Ocular hypertension, left eye: Secondary | ICD-10-CM | POA: Diagnosis not present

## 2018-01-12 DIAGNOSIS — I8311 Varicose veins of right lower extremity with inflammation: Secondary | ICD-10-CM | POA: Diagnosis not present

## 2018-02-13 DIAGNOSIS — I8311 Varicose veins of right lower extremity with inflammation: Secondary | ICD-10-CM | POA: Diagnosis not present

## 2018-04-24 DIAGNOSIS — H9201 Otalgia, right ear: Secondary | ICD-10-CM | POA: Diagnosis not present

## 2018-04-24 DIAGNOSIS — T169XXA Foreign body in ear, unspecified ear, initial encounter: Secondary | ICD-10-CM | POA: Diagnosis not present

## 2018-05-08 DIAGNOSIS — H40053 Ocular hypertension, bilateral: Secondary | ICD-10-CM | POA: Diagnosis not present

## 2018-05-10 DIAGNOSIS — Z23 Encounter for immunization: Secondary | ICD-10-CM | POA: Diagnosis not present

## 2018-05-10 DIAGNOSIS — H60511 Acute actinic otitis externa, right ear: Secondary | ICD-10-CM | POA: Diagnosis not present

## 2018-05-10 DIAGNOSIS — H9319 Tinnitus, unspecified ear: Secondary | ICD-10-CM | POA: Diagnosis not present

## 2018-05-22 IMAGING — CR DG LUMBAR SPINE COMPLETE 4+V
7 series · 7 of 7 positions shown · non-contrast
Comparison: 01/09/2013

CLINICAL DATA: Low back pain x3 years, bilateral leg swelling

EXAM:
LUMBAR SPINE - COMPLETE 4+ VIEW

[w l-spine lat * (1 of 3)]
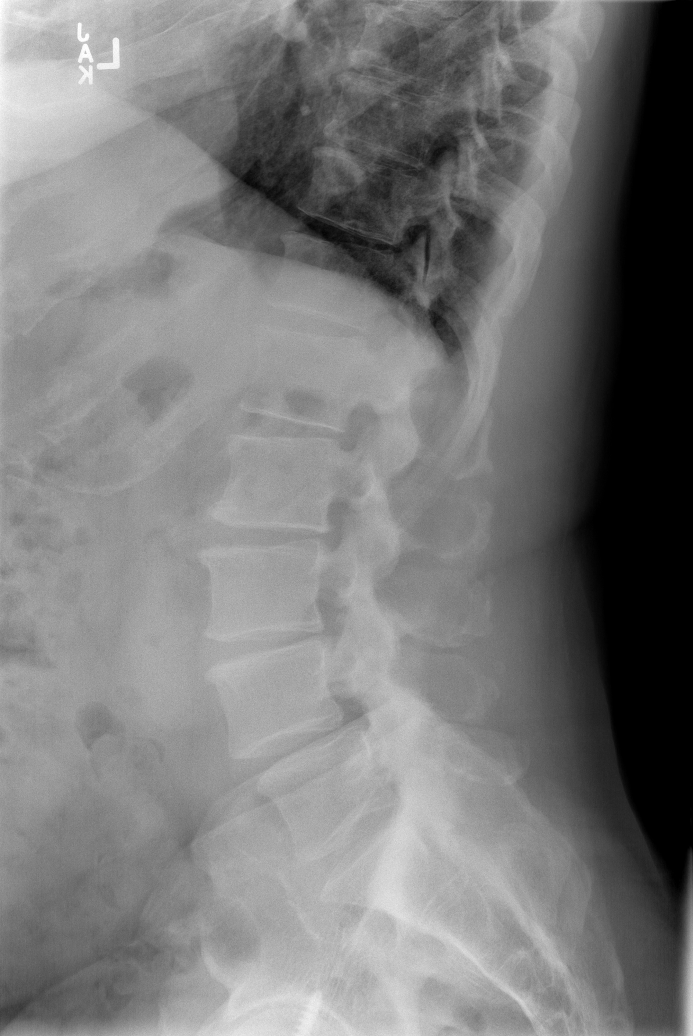

[w l-spine lat * (2 of 3)]
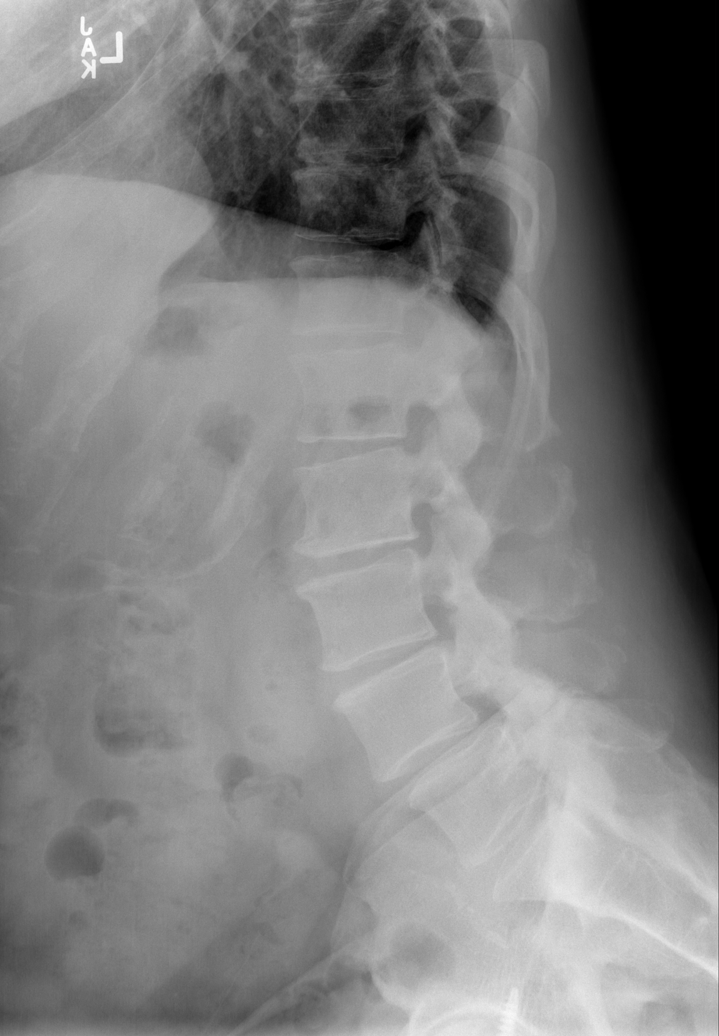

[w l-spine lat * (3 of 3)]
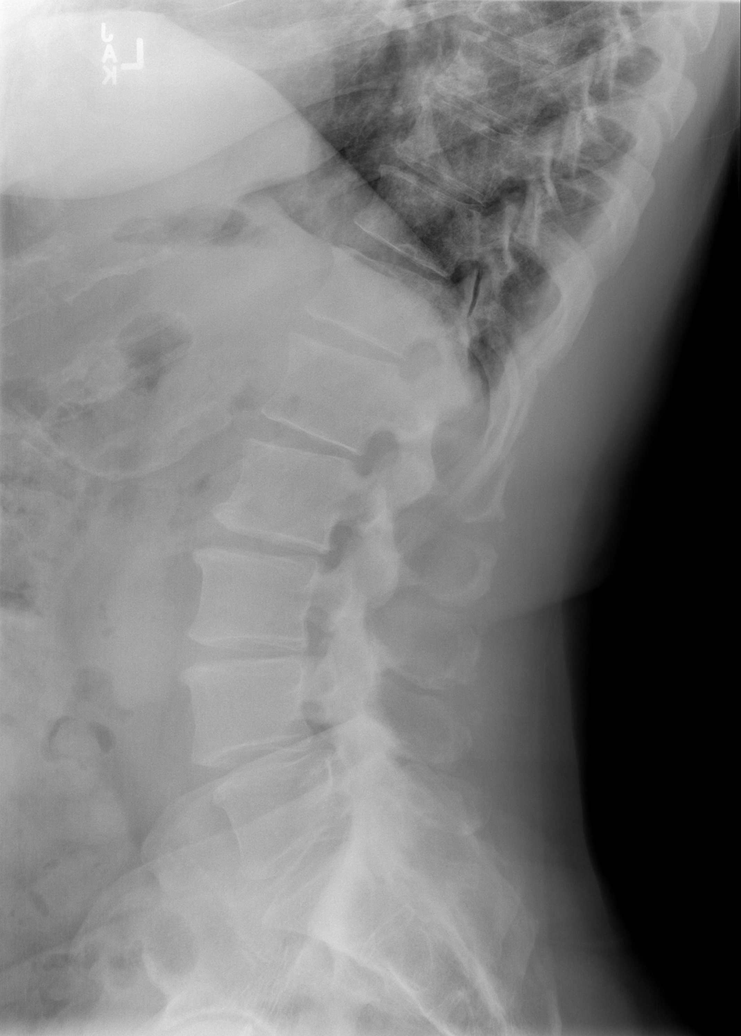

[t l-spine a.p.]
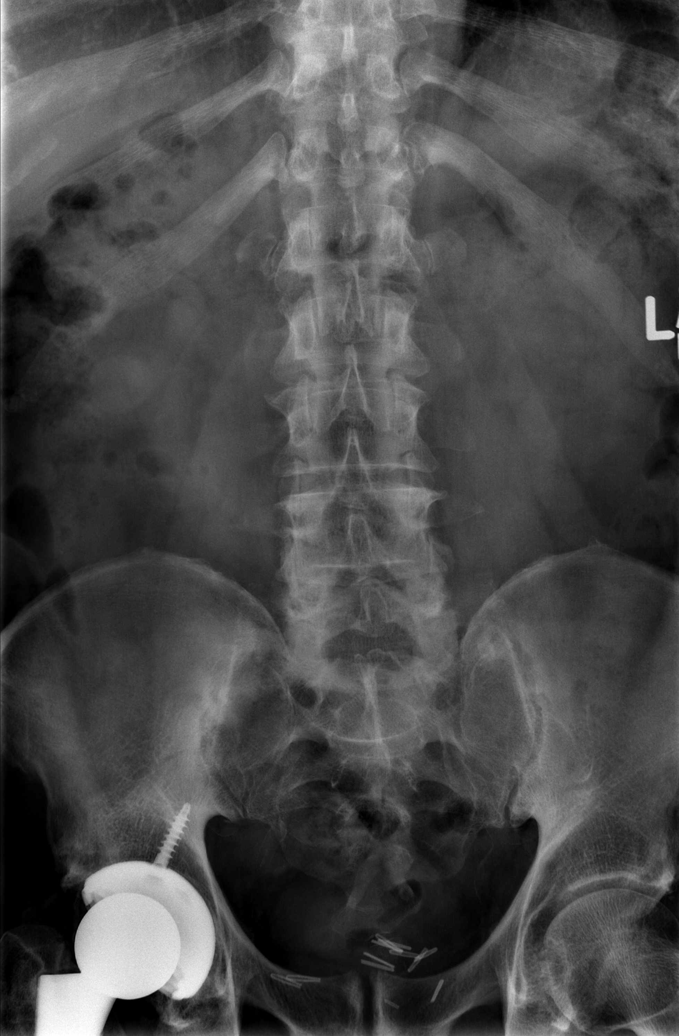

[t l-spine oblique exposure (1 of 2)]
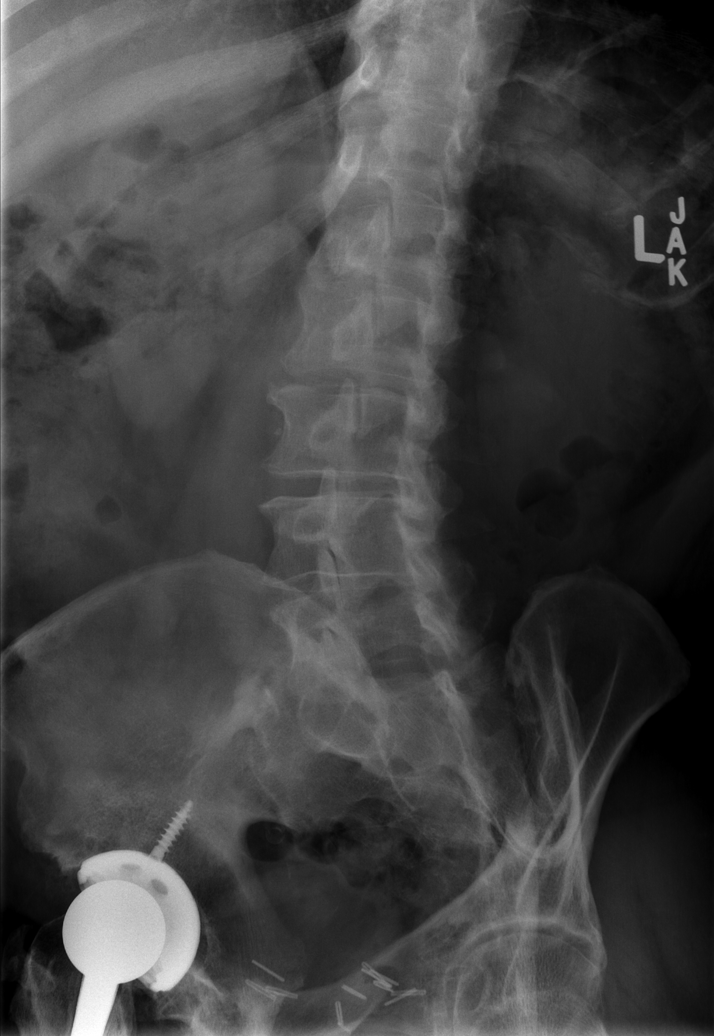

[t l-spine oblique exposure (2 of 2)]
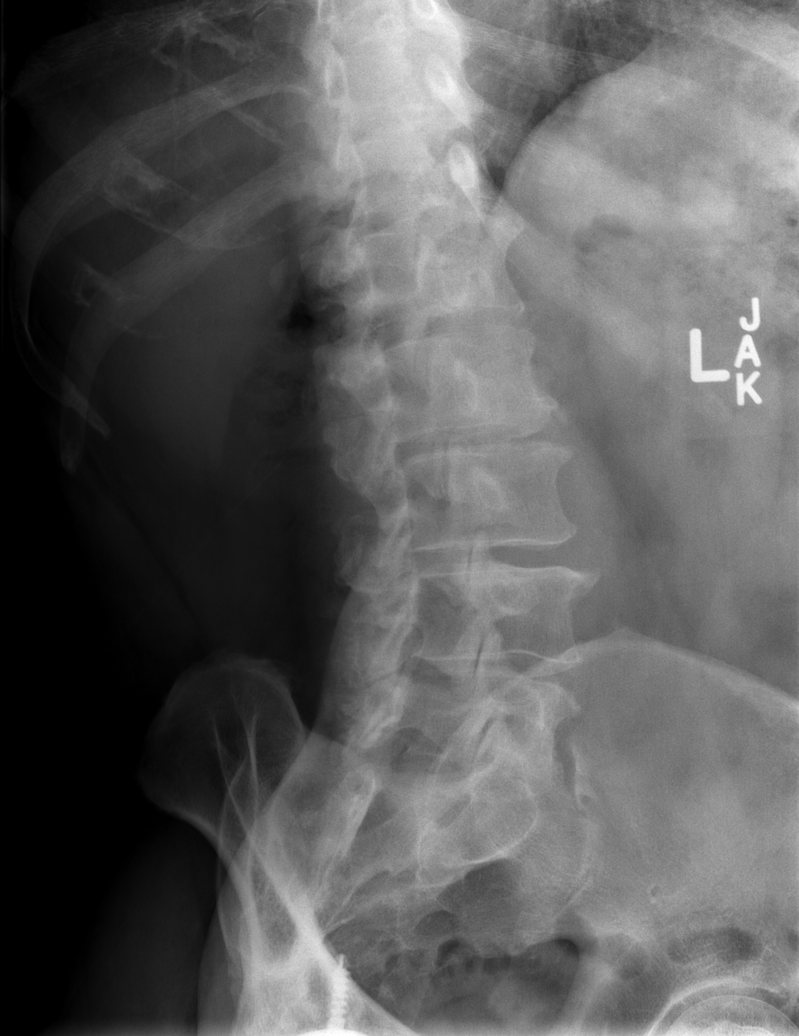

[t l-spine l5-s1 spot]
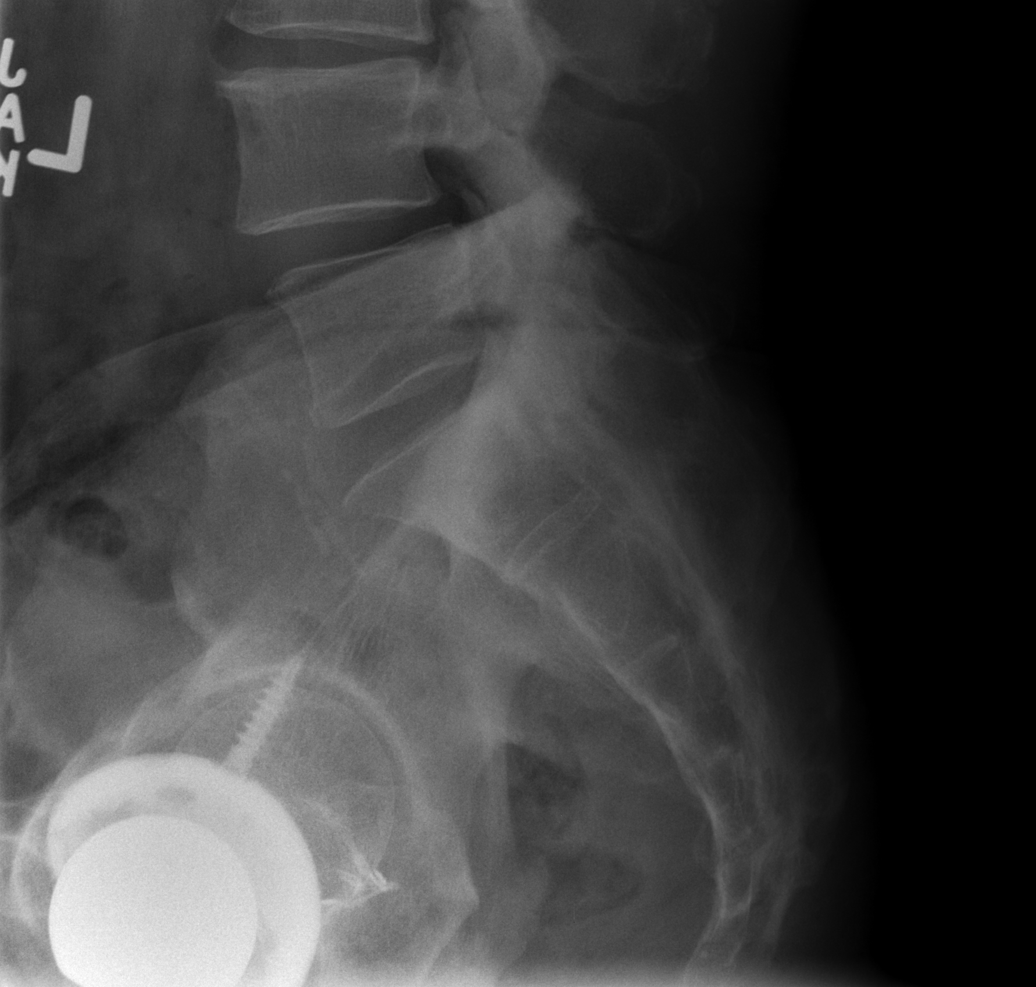

[7 of 7 positions shown; findings below may reference images not displayed]

FINDINGS: Five lumbar-type vertebral bodies.

Normal lumbar lordosis. Grade 1 anterolisthesis of L4 on L5,
chronic.

No evidence of fracture or dislocation. Vertebral body heights and
intervertebral disc spaces are maintained.

No abnormal motion with flexion/ extension.

Mild multilevel degenerative changes.

Visualized bony pelvis appears intact.

Status post right hip arthroplasty.

Surgical clips related to prior prostatectomy.
IMPRESSION: No fracture or dislocation is seen.

No abnormal motion with flexion/extension.

Mild degenerative changes. Grade 1 anterolisthesis of L4 on L5,
chronic.

Status post right hip arthroplasty.

## 2018-06-19 DIAGNOSIS — H40053 Ocular hypertension, bilateral: Secondary | ICD-10-CM | POA: Diagnosis not present

## 2018-07-24 DIAGNOSIS — J019 Acute sinusitis, unspecified: Secondary | ICD-10-CM | POA: Diagnosis not present

## 2018-07-24 DIAGNOSIS — H9191 Unspecified hearing loss, right ear: Secondary | ICD-10-CM | POA: Diagnosis not present

## 2018-07-24 DIAGNOSIS — H9311 Tinnitus, right ear: Secondary | ICD-10-CM | POA: Diagnosis not present

## 2018-08-09 DIAGNOSIS — H90A31 Mixed conductive and sensorineural hearing loss, unilateral, right ear with restricted hearing on the contralateral side: Secondary | ICD-10-CM | POA: Diagnosis not present

## 2018-08-09 DIAGNOSIS — H903 Sensorineural hearing loss, bilateral: Secondary | ICD-10-CM | POA: Diagnosis not present

## 2018-08-09 DIAGNOSIS — H90A22 Sensorineural hearing loss, unilateral, left ear, with restricted hearing on the contralateral side: Secondary | ICD-10-CM | POA: Diagnosis not present

## 2018-09-24 DIAGNOSIS — E785 Hyperlipidemia, unspecified: Secondary | ICD-10-CM | POA: Diagnosis not present

## 2018-09-24 DIAGNOSIS — N5231 Erectile dysfunction following radical prostatectomy: Secondary | ICD-10-CM | POA: Diagnosis not present

## 2018-09-24 DIAGNOSIS — C61 Malignant neoplasm of prostate: Secondary | ICD-10-CM | POA: Diagnosis not present

## 2018-09-24 DIAGNOSIS — E1121 Type 2 diabetes mellitus with diabetic nephropathy: Secondary | ICD-10-CM | POA: Diagnosis not present

## 2018-09-24 DIAGNOSIS — N393 Stress incontinence (female) (male): Secondary | ICD-10-CM | POA: Diagnosis not present

## 2018-09-24 DIAGNOSIS — Z0000 Encounter for general adult medical examination without abnormal findings: Secondary | ICD-10-CM | POA: Diagnosis not present

## 2018-09-24 DIAGNOSIS — N182 Chronic kidney disease, stage 2 (mild): Secondary | ICD-10-CM | POA: Diagnosis not present

## 2018-09-24 DIAGNOSIS — I10 Essential (primary) hypertension: Secondary | ICD-10-CM | POA: Diagnosis not present

## 2018-09-24 DIAGNOSIS — Z79899 Other long term (current) drug therapy: Secondary | ICD-10-CM | POA: Diagnosis not present

## 2018-10-11 DIAGNOSIS — N481 Balanitis: Secondary | ICD-10-CM | POA: Diagnosis not present

## 2018-10-11 DIAGNOSIS — R310 Gross hematuria: Secondary | ICD-10-CM | POA: Diagnosis not present

## 2018-10-11 DIAGNOSIS — N3000 Acute cystitis without hematuria: Secondary | ICD-10-CM | POA: Diagnosis not present

## 2018-10-11 DIAGNOSIS — C61 Malignant neoplasm of prostate: Secondary | ICD-10-CM | POA: Diagnosis not present

## 2018-10-23 DIAGNOSIS — R310 Gross hematuria: Secondary | ICD-10-CM | POA: Diagnosis not present

## 2018-10-23 DIAGNOSIS — C61 Malignant neoplasm of prostate: Secondary | ICD-10-CM | POA: Diagnosis not present

## 2018-12-17 ENCOUNTER — Other Ambulatory Visit: Payer: Self-pay | Admitting: Urology

## 2018-12-17 ENCOUNTER — Other Ambulatory Visit (HOSPITAL_COMMUNITY): Payer: Self-pay | Admitting: Urology

## 2018-12-17 DIAGNOSIS — R310 Gross hematuria: Secondary | ICD-10-CM | POA: Diagnosis not present

## 2018-12-17 DIAGNOSIS — N5231 Erectile dysfunction following radical prostatectomy: Secondary | ICD-10-CM | POA: Diagnosis not present

## 2018-12-17 DIAGNOSIS — C61 Malignant neoplasm of prostate: Secondary | ICD-10-CM | POA: Diagnosis not present

## 2018-12-17 DIAGNOSIS — R9721 Rising PSA following treatment for malignant neoplasm of prostate: Secondary | ICD-10-CM | POA: Diagnosis not present

## 2018-12-28 ENCOUNTER — Encounter (HOSPITAL_COMMUNITY)
Admission: RE | Admit: 2018-12-28 | Discharge: 2018-12-28 | Disposition: A | Discharge status: Home / Self Care | Payer: Medicare HMO | Source: Ambulatory Visit | Attending: Urology | Admitting: Urology

## 2018-12-28 ENCOUNTER — Other Ambulatory Visit: Payer: Self-pay

## 2018-12-28 DIAGNOSIS — C61 Malignant neoplasm of prostate: Secondary | ICD-10-CM | POA: Diagnosis not present

## 2018-12-28 DIAGNOSIS — K5730 Diverticulosis of large intestine without perforation or abscess without bleeding: Secondary | ICD-10-CM | POA: Diagnosis not present

## 2018-12-28 DIAGNOSIS — R310 Gross hematuria: Secondary | ICD-10-CM | POA: Diagnosis not present

## 2018-12-28 MED ORDER — TECHNETIUM TC 99M MEDRONATE IV KIT
21.3000 | PACK | Freq: Once | INTRAVENOUS | Status: AC | PRN
  Administered 2018-12-28: 21.3 via INTRAVENOUS

## 2019-01-02 ENCOUNTER — Other Ambulatory Visit (HOSPITAL_COMMUNITY): Payer: Medicare HMO

## 2019-01-02 ENCOUNTER — Ambulatory Visit (HOSPITAL_COMMUNITY): Payer: Medicare HMO

## 2019-01-09 DIAGNOSIS — C61 Malignant neoplasm of prostate: Secondary | ICD-10-CM | POA: Diagnosis not present

## 2019-01-09 DIAGNOSIS — R310 Gross hematuria: Secondary | ICD-10-CM | POA: Diagnosis not present

## 2019-01-28 DIAGNOSIS — N182 Chronic kidney disease, stage 2 (mild): Secondary | ICD-10-CM | POA: Diagnosis not present

## 2019-01-28 DIAGNOSIS — E1121 Type 2 diabetes mellitus with diabetic nephropathy: Secondary | ICD-10-CM | POA: Diagnosis not present

## 2019-01-28 DIAGNOSIS — E785 Hyperlipidemia, unspecified: Secondary | ICD-10-CM | POA: Diagnosis not present

## 2019-01-28 DIAGNOSIS — Z79899 Other long term (current) drug therapy: Secondary | ICD-10-CM | POA: Diagnosis not present

## 2019-01-28 DIAGNOSIS — I10 Essential (primary) hypertension: Secondary | ICD-10-CM | POA: Diagnosis not present

## 2019-02-01 DIAGNOSIS — E1165 Type 2 diabetes mellitus with hyperglycemia: Secondary | ICD-10-CM | POA: Diagnosis not present

## 2019-02-01 DIAGNOSIS — Z794 Long term (current) use of insulin: Secondary | ICD-10-CM | POA: Diagnosis not present

## 2019-02-04 DIAGNOSIS — E1121 Type 2 diabetes mellitus with diabetic nephropathy: Secondary | ICD-10-CM | POA: Diagnosis not present

## 2019-02-04 DIAGNOSIS — Z794 Long term (current) use of insulin: Secondary | ICD-10-CM | POA: Diagnosis not present

## 2019-03-12 DIAGNOSIS — E1165 Type 2 diabetes mellitus with hyperglycemia: Secondary | ICD-10-CM | POA: Diagnosis not present

## 2019-03-12 DIAGNOSIS — Z23 Encounter for immunization: Secondary | ICD-10-CM | POA: Diagnosis not present

## 2019-03-12 DIAGNOSIS — Z7984 Long term (current) use of oral hypoglycemic drugs: Secondary | ICD-10-CM | POA: Diagnosis not present

## 2020-06-02 ENCOUNTER — Other Ambulatory Visit: Payer: Self-pay

## 2020-06-24 ENCOUNTER — Other Ambulatory Visit: Payer: Self-pay

## 2020-06-24 DIAGNOSIS — M7989 Other specified soft tissue disorders: Secondary | ICD-10-CM

## 2020-07-07 ENCOUNTER — Other Ambulatory Visit: Payer: Self-pay

## 2020-07-07 ENCOUNTER — Ambulatory Visit (HOSPITAL_COMMUNITY)
Admission: RE | Admit: 2020-07-07 | Discharge: 2020-07-07 | Disposition: A | Discharge status: Home / Self Care | Payer: Medicare Other | Source: Ambulatory Visit | Attending: Physician Assistant | Admitting: Physician Assistant

## 2020-07-07 ENCOUNTER — Ambulatory Visit (INDEPENDENT_AMBULATORY_CARE_PROVIDER_SITE_OTHER): Payer: Medicare Other | Admitting: Physician Assistant

## 2020-07-07 VITALS — BP 151/81 | HR 84 | Temp 97.2°F | Resp 20 | Ht 71.0 in | Wt 201.9 lb

## 2020-07-07 DIAGNOSIS — M7989 Other specified soft tissue disorders: Secondary | ICD-10-CM | POA: Diagnosis present

## 2020-07-07 NOTE — Progress Notes (Signed)
[facility]           OF [place]  History and Physical   Paul Alvarado is a 79 y.o. male who presents with leg swelling.    He was seen by Dr. Ardelle Anton in 2018 and at that time, pt was concerned about 2 years of leg swelling with right worse than left.  The right leg swelling had started after a fall.  He was told he did have some nerve damage in the right leg.   His pain had subsided, but he continued to have swelling.  He did wear compression that helped but swelling worsened when he wasn't wearing them.  His legs were itchy.  He had ABI's at that visit and it revealed 1.07 right and 1.05 left.  He did have a reflux study on 04/13/2017, which revealed venous incompetence in the right SFJ, GSV in the proximal thigh, SSV in the mid calf, CFV and popliteal vein.  He had venous incompetence in the left CFV.  There was no evidence of DVT.   He was not seen by vascular surgery at that time.   He comes in today for evaluation of leg swelling.  He states that after his fall a few years ago, he has had right leg swelling.  He states the left swells but it is very little compared to the right.  He does not have achiness or pain.  He does c/o stiffness on the top of the right foot near the ankle.  He states the left leg is stiff but not painful.  He does not recall any issues with his family having leg swelling.  He does not have hx of DVT.  He states he has worn compression but not regularly.  When he does wear it, the swelling is much improved.  He does not have any skin color changes.  He states that he does elevate his legs regularly but after being in bed all night, he swelling is improved.  He has hx of right total hip arthroplasty.  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  He does have some cramping in both legs that can happen at any time while sitting or standing.  He does not have evidence of claudication.    The pt is on a statin for cholesterol management.  The pt is not on  a daily aspirin.   Other AC:  none The pt is on CCB, ACEI for hypertension.   The pt is with borderline DM and not on medications, watching diet Tobacco hx:  former   Past Medical History:  Diagnosis Date   Acute cystitis with hematuria    Arthritis    Diabetes mellitus without complication (HCC)    no meds, borderline, watching diet    First degree AV block    Hearing loss    Requires Hearing Aids   Hyperlipidemia    Hypertension    Borderline hypertension.   Pedal edema    Pneumonia    hx of walking pneumonia    Stress incontinence    Post prostate surgery    Past Surgical History:  Procedure Laterality Date   COLONOSCOPY     PROSTATE SURGERY     TOTAL HIP ARTHROPLASTY Right 05/20/2014   Procedure: RIGHT TOTAL HIP ARTHROPLASTY ANTERIOR APPROACH;  Surgeon: Shelda Pal, MD;  Location: [facility];  Service: Orthopedics;  Laterality: Right;    Social History   Socioeconomic History  Marital status: Married    Spouse name: Not on file   Number of children: 1   Years of education: Not on file   Highest education level: Not on file  Occupational History   Occupation: Retired    Comment: Administrator  Tobacco Use   Smoking status: Former Smoker    Types: Cigarettes    Quit date: 1962    Years since quitting: 60.0   Smokeless tobacco: Never Used  Substance and Sexual Activity   Alcohol use: No   Drug use: No   Sexual activity: Not on file  Other Topics Concern   Not on file  Social History Narrative   Not on file   Social Determinants of Health   Financial Resource Strain: Not on file  Food Insecurity: Not on file  Transportation Needs: Not on file  Physical Activity: Not on file  Stress: Not on file  Social Connections: Not on file  Intimate Partner Violence: Not on file    Family History: No hx of AAA or venous disorders  Current Outpatient Medications  Medication Sig Dispense Refill   amLODipine-benazepril (LOTREL) 5-40  MG capsule      docusate sodium 100 MG CAPS Take 100 mg by mouth 2 (two) times daily. (Patient not taking: Reported on 04/12/2017) 10 capsule 0   ferrous sulfate 325 (65 FE) MG tablet Take 1 tablet (325 mg total) by mouth 3 (three) times daily after meals. (Patient not taking: Reported on 04/12/2017)  3   HYDROcodone-acetaminophen (NORCO) 7.5-325 MG per tablet Take 1-2 tablets by mouth every 4 (four) hours as needed for moderate pain. (Patient not taking: Reported on 04/12/2017) 100 tablet 0   polyethylene glycol (MIRALAX / GLYCOLAX) packet Take 17 g by mouth 2 (two) times daily. (Patient not taking: Reported on 04/12/2017) 14 each 0   promethazine (PHENERGAN) 12.5 MG tablet Take 1 tablet (12.5 mg total) by mouth every 6 (six) hours as needed for nausea or vomiting. (Patient not taking: Reported on 04/12/2017) 30 tablet 0   tiZANidine (ZANAFLEX) 4 MG tablet Take 1 tablet (4 mg total) by mouth every 6 (six) hours as needed for muscle spasms. (Patient not taking: Reported on 04/12/2017) 30 tablet 0   No current facility-administered medications for this visit.    No Known Allergies  REVIEW OF SYSTEMS:   [X]  denotes positive finding, [ ]  denotes negative finding Cardiac  Comments:  Chest pain or chest pressure:    Shortness of breath upon exertion:    Short of breath when lying flat:    Irregular heart rhythm:        Vascular    Pain in calf, thigh, or hip brought on by ambulation:    Pain in feet at night that wakes you up from your sleep:     Blood clot in your veins:    Leg swelling:  x       Pulmonary    Oxygen at home:    Productive cough:     Wheezing:         Neurologic    Sudden weakness in arms or legs:     Sudden numbness in arms or legs:     Sudden onset of difficulty speaking or slurred speech:    Temporary loss of vision in one eye:     Problems with dizziness:         Gastrointestinal    Blood in stool:     Vomited blood:  Genitourinary    Burning when  urinating:     Blood in urine:        Psychiatric    Major depression:         Hematologic    Bleeding problems:    Problems with blood clotting too easily:        Skin    Rashes or ulcers:        Constitutional    Fever or chills:      PHYSICAL EXAMINATION:  Today's Vitals   07/07/20 1443  BP: (!) 151/81  Pulse: 84  Resp: 20  Temp: (!) 97.2 F (36.2 C)  TempSrc: Temporal  SpO2: 98%  Weight: 201 lb 14.4 oz (91.6 kg)  Height: 5\' 11"  (1.803 m)  PainSc: 0-No pain   Body mass index is 28.16 kg/m.   General:  WDWN in NAD; vital signs documented above Gait: Not observed HENT: WNL, normocephalic Pulmonary: normal non-labored breathing without wheezing Cardiac: regular HR; without carotid bruits Abdomen: soft, NT, no masses; aortic pulse is not palpable Skin: without rashes Vascular Exam/Pulses:  Right Left  Radial 2+ (normal) 2+ (normal)  DP 2+ (normal) 2+ (normal)  PT Unable to palpate Unable to palpate   Extremities: without ischemic changes, without cellulitis; without open wounds; without skin color changes Musculoskeletal: no muscle wasting or atrophy  Neurologic: A&O X 3;  moving all extremities equally Psychiatric:  The pt has Normal affect.   Non-Invasive Vascular Imaging:   Venous duplex on 07/07/2020: Venous Reflux Times  +--------------+---------+------+-----------+------------+--------+   RIGHT      Reflux No Reflux Reflux Time Diameter cms Comments                   Yes                       +--------------+---------+------+-----------+------------+--------+   CFV              yes   >1 second                +--------------+---------+------+-----------+------------+--------+   FV mid     no                              +--------------+---------+------+-----------+------------+--------+   Popliteal    no                               +--------------+---------+------+-----------+------------+--------+   GSV at SFJ          yes   >500 ms    0.575          +--------------+---------+------+-----------+------------+--------+   GSV prox thigh                         NWV      +--------------+---------+------+-----------+------------+--------+   GSV mid thigh                          NWV      +--------------+---------+------+-----------+------------+--------+   GSV dist thigh                         NWV      +--------------+---------+------+-----------+------------+--------+   SSV Pop Fossa  no  0.339          +--------------+---------+------+-----------+------------+--------+   SSV prox calf  no                  0.274          +--------------+---------+------+-----------+------------+--------+   SSV mid calf  no                  0.274          +--------------+---------+------+-----------+------------+--------+     +--------------+---------+------+-----------+------------+--------+   LEFT      Reflux No Reflux Reflux Time Diameter cms Comments                   Yes                       +--------------+---------+------+-----------+------------+--------+   CFV              yes   >1 second                +--------------+---------+------+-----------+------------+--------+   FV mid            yes   >1 second                +--------------+---------+------+-----------+------------+--------+   Popliteal    no                              +--------------+---------+------+-----------+------------+--------+   GSV at SFJ          yes   >500 ms    0.758          +--------------+---------+------+-----------+------------+--------+   GSV prox thigh        yes   >500 ms     0.393          +--------------+---------+------+-----------+------------+--------+   GSV mid thigh         yes   >500 ms    0.356          +--------------+---------+------+-----------+------------+--------+   GSV dist thigh no                  0.212          +--------------+---------+------+-----------+------------+--------+   GSV at knee   no                  0.212          +--------------+---------+------+-----------+------------+--------+   GSV prox calf                     0.218          +--------------+---------+------+-----------+------------+--------+   SSV Pop Fossa  no                  0.331          +--------------+---------+------+-----------+------------+--------+   SSV prox calf  no                  0.216          +--------------+---------+------+-----------+------------+--------+   SSV mid calf  no                  0.32          +--------------+---------+------+-----------+------------+--------+     Summary:  Right:  - No evidence of deep vein thrombosis seen in the right lower extremity,  from the common femoral through the popliteal veins.  - No evidence of superficial venous thrombosis  in the right lower  extremity.  - Deep vein reflux in the CFV.   - Superficial vein reflux in the SFJ. GSV not visualized.   Left:  - No evidence of deep vein thrombosis seen in the left lower extremity,  from the common femoral through the popliteal veins.  - No evidence of superficial venous thrombosis in the left lower  extremity.  - Deep vein reflux in the CFV and FV. Superficial vein reflux in the SFJ,  proximal and mid GSV.   Previous ABI and venous duplex 2018: ABI's at that visit and it revealed 1.07 right and 1.05 left.  He did have a reflux study on 04/13/2017, which revealed venous incompetence in the right SFJ, GSV in the proximal thigh, SSV  in the mid calf, CFV and popliteal vein.  He had venous incompetence in the left CFV.  There was no evidence of DVT.   Paul Alvarado is a 79 y.o. male who presents with: leg swelling with right>left  -pt has easily palpable DP pulses bilaterally.  -pt does not have evidence of DVT bilaterally -he does have venous reflux in the deep system bilaterally.  He does have venous reflux in the right SFJ and in the SFJ, GSV on the left, however, the diameter of the vein is not amendable to laser ablation. -discussed with pt about wearing thigh high 20-67mmHg compression stockings.  He was measured today. -discussed the importance of leg elevation and how to elevate as well as swimming and water aerobics.  -handout given to pt.    -discussed importance of weight loss -pt will f/u as needed.    Leontine Locket, [facility] 07/07/2020 1:42 PM  Clinic MD:  Stanford Breed
